# Patient Record
Sex: Male | Born: 1965 | Race: White | Hispanic: No | Marital: Married | Smoking: Former smoker
Health system: Southern US, Community
[De-identification: ages and names within clinical notes are randomized; demographics above are authoritative.]

## PROBLEM LIST (undated history)

## (undated) DIAGNOSIS — T7840XA Allergy, unspecified, initial encounter: Secondary | ICD-10-CM

## (undated) DIAGNOSIS — G4730 Sleep apnea, unspecified: Secondary | ICD-10-CM

## (undated) DIAGNOSIS — E039 Hypothyroidism, unspecified: Secondary | ICD-10-CM

## (undated) DIAGNOSIS — E785 Hyperlipidemia, unspecified: Secondary | ICD-10-CM

## (undated) DIAGNOSIS — K219 Gastro-esophageal reflux disease without esophagitis: Secondary | ICD-10-CM

## (undated) DIAGNOSIS — I2510 Atherosclerotic heart disease of native coronary artery without angina pectoris: Secondary | ICD-10-CM

## (undated) HISTORY — DX: Allergy, unspecified, initial encounter: T78.40XA

## (undated) HISTORY — PX: POLYPECTOMY: SHX149

## (undated) HISTORY — DX: Sleep apnea, unspecified: G47.30

## (undated) HISTORY — DX: Hyperlipidemia, unspecified: E78.5

## (undated) HISTORY — DX: Hypothyroidism, unspecified: E03.9

## (undated) HISTORY — DX: Atherosclerotic heart disease of native coronary artery without angina pectoris: I25.10

## (undated) HISTORY — PX: CARDIAC CATHETERIZATION: SHX172

## (undated) HISTORY — DX: Gastro-esophageal reflux disease without esophagitis: K21.9

---

## 1992-03-13 HISTORY — PX: UVULOPALATOPHARYNGOPLASTY: SHX827

## 1992-03-13 HISTORY — PX: ADENOIDECTOMY: SUR15

## 1998-03-16 ENCOUNTER — Emergency Department (HOSPITAL_COMMUNITY): Admission: EM | Admit: 1998-03-16 | Discharge: 1998-03-16 | Payer: Self-pay | Admitting: Emergency Medicine

## 1999-03-24 ENCOUNTER — Ambulatory Visit (HOSPITAL_COMMUNITY): Admission: RE | Admit: 1999-03-24 | Discharge: 1999-03-24 | Payer: Self-pay | Admitting: Internal Medicine

## 1999-03-24 ENCOUNTER — Encounter: Payer: Self-pay | Admitting: Internal Medicine

## 2000-01-07 ENCOUNTER — Emergency Department (HOSPITAL_COMMUNITY): Admission: EM | Admit: 2000-01-07 | Discharge: 2000-01-07 | Payer: Self-pay | Admitting: Emergency Medicine

## 2000-10-26 ENCOUNTER — Ambulatory Visit (HOSPITAL_COMMUNITY): Admission: RE | Admit: 2000-10-26 | Discharge: 2000-10-26 | Payer: Self-pay | Admitting: Internal Medicine

## 2001-08-08 ENCOUNTER — Encounter: Payer: Self-pay | Admitting: Internal Medicine

## 2001-08-08 ENCOUNTER — Encounter: Admission: RE | Admit: 2001-08-08 | Discharge: 2001-08-08 | Payer: Self-pay | Admitting: Internal Medicine

## 2009-02-08 ENCOUNTER — Encounter: Admission: RE | Admit: 2009-02-08 | Discharge: 2009-02-08 | Payer: Self-pay | Admitting: Internal Medicine

## 2009-06-18 ENCOUNTER — Ambulatory Visit: Payer: Self-pay | Admitting: Pulmonary Disease

## 2009-06-18 DIAGNOSIS — G4733 Obstructive sleep apnea (adult) (pediatric): Secondary | ICD-10-CM

## 2009-06-18 DIAGNOSIS — E039 Hypothyroidism, unspecified: Secondary | ICD-10-CM | POA: Insufficient documentation

## 2009-06-18 DIAGNOSIS — E785 Hyperlipidemia, unspecified: Secondary | ICD-10-CM | POA: Insufficient documentation

## 2009-06-18 DIAGNOSIS — K219 Gastro-esophageal reflux disease without esophagitis: Secondary | ICD-10-CM

## 2009-07-14 ENCOUNTER — Encounter: Payer: Self-pay | Admitting: Pulmonary Disease

## 2009-07-14 ENCOUNTER — Ambulatory Visit (HOSPITAL_BASED_OUTPATIENT_CLINIC_OR_DEPARTMENT_OTHER): Admission: RE | Admit: 2009-07-14 | Discharge: 2009-07-14 | Payer: Self-pay | Admitting: Pulmonary Disease

## 2009-07-25 ENCOUNTER — Ambulatory Visit: Payer: Self-pay | Admitting: Pulmonary Disease

## 2009-07-26 ENCOUNTER — Telehealth (INDEPENDENT_AMBULATORY_CARE_PROVIDER_SITE_OTHER): Payer: Self-pay | Admitting: *Deleted

## 2009-07-28 ENCOUNTER — Ambulatory Visit: Payer: Self-pay | Admitting: Pulmonary Disease

## 2009-09-01 ENCOUNTER — Ambulatory Visit: Payer: Self-pay | Admitting: Pulmonary Disease

## 2009-11-27 ENCOUNTER — Encounter: Payer: Self-pay | Admitting: Pulmonary Disease

## 2010-02-08 ENCOUNTER — Encounter (INDEPENDENT_AMBULATORY_CARE_PROVIDER_SITE_OTHER): Payer: Self-pay | Admitting: *Deleted

## 2010-02-24 ENCOUNTER — Ambulatory Visit: Payer: Self-pay | Admitting: Pulmonary Disease

## 2010-03-22 ENCOUNTER — Ambulatory Visit: Admit: 2010-03-22 | Payer: Self-pay | Admitting: Pulmonary Disease

## 2010-03-23 ENCOUNTER — Telehealth: Payer: Self-pay | Admitting: Pulmonary Disease

## 2010-04-05 ENCOUNTER — Ambulatory Visit
Admission: RE | Admit: 2010-04-05 | Discharge: 2010-04-05 | Payer: Self-pay | Source: Home / Self Care | Attending: Pulmonary Disease | Admitting: Pulmonary Disease

## 2010-04-12 NOTE — Letter (Signed)
SummaryScience writer Pulmonary Care Appointment Letter  [facility]  [address]   [place], [place] [address]   Phone: [phone]  Fax: [phone]    02/08/2010 MRN: [id number]  CHARES SLAYMAKER [address] [place], [place]  [address]  Dear Mr. MATSUURA,   Our office is attempting to contact you about an appointment.  Please call our office at [phone] to schedule this appointment with Dr._Clance. Dr. Shelle Iron is not going to be in the office on 03/03/10. We need to reschedule your appointment.   Our registration staff is prepared to assist you with any questions you may have.    Thank you,   Nature conservation officer Pulmonary Division

## 2010-04-12 NOTE — Assessment & Plan Note (Signed)
Summary: consult for possible osa   Copy to:  Nila Nephew Primary Provider/Referring Provider:  Nila Nephew   CC:  Sleep Consult.  History of Present Illness: The pt is a 45y/o male who comes in today for evaluation of possible osa.  He has a long h/o snoring, and underwent UP3 by Dr. Ezzard Standing in 1994.  This did help, but now the snoring has worsened, and he has been noted to have pauses in his breathing during sleep.  He goes to bed btw 9-10am, and arises at 5:30am to start his day.  He has frequent awakenings at night, and has nonrestorative sleep.  He does have some sleep pressure at work, but stays very active most days.  He does fall asleep easily in the evenings watching tv or movies.  He also admits to sleep pressure with his drive home, which is 35min.  His weight is fairly neutral over the past 2 years, and his epworth score is 16.  Preventive Screening-Counseling & Management  Alcohol-Tobacco     Smoking Status: quit  Current Medications (verified): 1)  Synthroid 75 Mcg Tabs (Levothyroxine Sodium) .... Take 1 Tablet By Mouth Once A Day 2)  Crestor 5 Mg Tabs (Rosuvastatin Calcium) .... Take 1 Tablet By Mouth Once A Day 3)  Nexium 40 Mg Cpdr (Esomeprazole Magnesium) .... Take 1 Tablet By Mouth Once A Day  Allergies (verified): No Known Drug Allergies  Past History:  Past Medical History:  G E R D (ICD-530.81) HYPOTHYROIDISM (ICD-244.9) HYPERLIPIDEMIA (ICD-272.4)    Past Surgical History: s/p UP3 in 1994  Family History: Reviewed history and no changes required. emphysema: father cancer: paternal grandfather (liver) maternal grandmother (liver) maternal grandfather (prostate)   Social History: Reviewed history and no changes required. Patient states former smoker.  started at age 58.  2ppd.  quit 1995. pt is married and lives with wife and 2 daughters. pt has children. pt works as a Dietitian.  Smoking Status:  quit  Review of Systems       The patient  complains of acid heartburn and joint stiffness or pain.  The patient denies shortness of breath with activity, shortness of breath at rest, productive cough, non-productive cough, coughing up blood, chest pain, irregular heartbeats, indigestion, loss of appetite, weight change, abdominal pain, difficulty swallowing, sore throat, tooth/dental problems, headaches, nasal congestion/difficulty breathing through nose, sneezing, itching, ear ache, anxiety, depression, hand/feet swelling, rash, change in color of mucus, and fever.    Vital Signs:  Patient profile:   45 year old male Height:      71 inches Weight:      229.50 pounds BMI:     32.12 O2 Sat:      95 % on Room air Temp:     97.8 degrees F oral Pulse rate:   86 / minute BP sitting:   114 / 78  (right arm) Cuff size:   regular  Vitals Entered By: Arman Filter LPN (June 19, 1306 3:01 PM)  O2 Flow:  Room air CC: Sleep Consult Comments Medications reviewed with patient Arman Filter LPN  June 18, 6576 3:01 PM    Physical Exam  General:  ow male in nad Eyes:  PERRLA and EOMI.   Nose:  narrowed bilat but patent Mouth:  trimmed palate and uvula Neck:  no jvd, tmg, LN Lungs:  clear to auscultation Heart:  rrr, no mrg Abdomen:  soft and nontender, bs+ Extremities:  no edema or cyanosis pulses intact distallly  Neurologic:  alert and oriented, moves all 4.   Impression & Recommendations:  Problem # 1:  OBSTRUCTIVE SLEEP APNEA (ICD-327.23) the pt's history is very suggestive of osa, and he is overweight.  I have had a long discussion with him about osa, including its impact on his CV health and QOL.  I think there is more than enough suspicion to proceed with a sleep study for diagnosis, and the pt agrees.   Medications Added to Medication List This Visit: 1)  Synthroid 75 Mcg Tabs (Levothyroxine sodium) .... Take 1 tablet by mouth once a day 2)  Crestor 5 Mg Tabs (Rosuvastatin calcium) .... Take 1 tablet by mouth once a  day 3)  Nexium 40 Mg Cpdr (Esomeprazole magnesium) .... Take 1 tablet by mouth once a day  Other Orders: Consultation Level IV (84696) Sleep Disorder Referral (Sleep Disorder)  Patient Instructions: 1)  will schedule for sleep study, and will call you to arrange followup once results are available. 2)  work on weight loss.

## 2010-04-12 NOTE — Miscellaneous (Signed)
Summary: optimal cpap pressure 10cm  Clinical Lists Changes  Orders: Added new Referral order of DME Referral (DME) - Signed auto shows good compliance, no leaks, and optmal pressure of 10cm

## 2010-04-12 NOTE — Progress Notes (Signed)
Summary: need to sched ov with kc   ---- Converted from flag ---- ---- 07/26/2009 9:38 AM, Arman Filter LPN wrote: pt needs ov with kc to discuss sleep study results. ------------------------------  [id number].  Aundra Millet Artin Mceuen LPN  Jul 26, 2009 9:39 AM LMOMTCBx2.  Arman Filter LPN  Jul 27, 2009 9:48 AM  pt scheduled to see [facility] 07-28-2009 at 10:00am.  Arman Filter LPN  Jul 27, 2009 11:13 AM

## 2010-04-12 NOTE — Assessment & Plan Note (Signed)
Summary: rov for osa   Copy to:  Nila Nephew Primary Provider/Referring Provider:  Nila Nephew   CC:  5 week rov - F/U on Cpap - Wearing 6 hrs per night - Mask doing good - sinus drainage (green-yellow) for 2 weeks - cough in am - taking mucinex.  History of Present Illness: the pt comes in today for f/u of his known symptomatic moderate osa.  He was started on cpap at the last visit, and feels that he is doing well.  He noticed on the 2 night things were improving.  He sleeps well with the device, and notes that his alertness and memory are greatly improved during the day.  He is using nasal pillows currently without issues, and has adapted well to the pressure.  He has developed a cold, but is improving with mucinex.  Current Medications (verified): 1)  Synthroid 100 Mcg Tabs (Levothyroxine Sodium) .... Take One By Mouth Once Daily 2)  Crestor 5 Mg Tabs (Rosuvastatin Calcium) .... Take 1 Tablet By Mouth Once A Day 3)  Nexium 40 Mg Cpdr (Esomeprazole Magnesium) .... Take 1 Tablet By Mouth Once A Day 4)  Mucinex Dm 30-600 Mg Xr12h-Tab (Dextromethorphan-Guaifenesin) .... Take One By Mouth Two Times A Day As Needed  Allergies (verified): No Known Drug Allergies  Past History:  Past Medical History: Reviewed history from 06/18/2009 and no changes required.  G E R D (ICD-530.81) HYPOTHYROIDISM (ICD-244.9) HYPERLIPIDEMIA (ICD-272.4)    Past Surgical History: Reviewed history from 06/18/2009 and no changes required. s/p UP3 in 1994  Family History: Reviewed history from 06/18/2009 and no changes required. emphysema: father cancer: paternal grandfather (liver) maternal grandmother (liver) maternal grandfather (prostate)   Social History: Reviewed history from 06/18/2009 and no changes required. Patient states former smoker.  started at age 75.  2ppd.  quit 1995. pt is married and lives with wife and 2 daughters. pt has children. pt works as a Dietitian.   Review of  Systems       The patient complains of productive cough.  The patient denies shortness of breath with activity, shortness of breath at rest, non-productive cough, coughing up blood, chest pain, irregular heartbeats, acid heartburn, indigestion, loss of appetite, weight change, abdominal pain, difficulty swallowing, sore throat, tooth/dental problems, headaches, nasal congestion/difficulty breathing through nose, sneezing, itching, ear ache, anxiety, depression, hand/feet swelling, joint stiffness or pain, rash, change in color of mucus, and fever.    Vital Signs:  Patient profile:   45 year old male Height:      71 inches Weight:      227 pounds BMI:     31.77 O2 Sat:      98 % on Room air Temp:     97.5 degrees F oral Pulse rate:   55 / minute BP sitting:   120 / 80  (right arm) Cuff size:   regular  Vitals Entered By: Abigail Miyamoto RN (September 01, 2009 9:05 AM)  O2 Flow:  Room air  Physical Exam  General:  ow male in nad Nose:  mild erythema, no purulence no skin breakdown or pressure necrosis from cpap mask Mouth:  clear Extremities:  no edema or cyanosis Neurologic:  alert and oriented, moves all 4. does not appear sleepy.   Impression & Recommendations:  Problem # 1:  OBSTRUCTIVE SLEEP APNEA (ICD-327.23) the pt is doing very well with cpap, and has a 100% compliance on his download.  He has seen great improvement in sleep quality and  daytime alertness.  He is having no issues with mask or pressure.  We need to optimize his pressure for him, and he needs to work aggressively on weight loss.  Care Plan:  At this point, will arrange for the patient's machine to be changed over to auto mode for 2 weeks to optimize their pressure.  I will review the downloaded data once sent by dme, and also evaluate for compliance, leaks, and residual osa.  I will call the patient and dme to discuss the results, and have the patient's machine set appropriately.  This will serve as the pt's cpap  pressure titration.  Medications Added to Medication List This Visit: 1)  Synthroid 100 Mcg Tabs (Levothyroxine sodium) .... Take one by mouth once daily 2)  Mucinex Dm 30-600 Mg Xr12h-tab (Dextromethorphan-guaifenesin) .... Take one by mouth two times a day as needed  Other Orders: Est. Patient Level IV (16109) DME Referral (DME)  Patient Instructions: 1)  will get your pressure optimized with the auto setting on your machine over the next 2 weeks.  I will let you know the results. 2)  continue to work on weight loss 3)  followup with me in 6mos.

## 2010-04-12 NOTE — Assessment & Plan Note (Signed)
Summary: rov to review sleep study   Copy to:  Nila Nephew Primary Provider/Referring Provider:  Nila Nephew   CC:  Pt is here for a f/u appt to discuss sleep study results.  Pt also c/o PND and coughing up small amount of yellow sputum. Marland Kitchen  History of Present Illness: the pt comes in today for f/u of his recent sleep study.  He was found to have AHI of 27/hr with desat to 86%.  I have reviewed the study with him in detail, and answered all of his questions.  Medications Prior to Update: 1)  Synthroid 75 Mcg Tabs (Levothyroxine Sodium) .... Take 1 Tablet By Mouth Once A Day 2)  Crestor 5 Mg Tabs (Rosuvastatin Calcium) .... Take 1 Tablet By Mouth Once A Day 3)  Nexium 40 Mg Cpdr (Esomeprazole Magnesium) .... Take 1 Tablet By Mouth Once A Day  Allergies (verified): No Known Drug Allergies  Vital Signs:  Patient profile:   45 year old male Height:      71 inches Weight:      228 pounds O2 Sat:      97 % on Room air Temp:     97.9 degrees F oral Pulse rate:   88 / minute BP sitting:   134 / 80  (right arm) Cuff size:   regular  Vitals Entered By: Arman Filter LPN (Jul 28, 2009 9:57 AM)  O2 Flow:  Room air CC: Pt is here for a f/u appt to discuss sleep study results.  Pt also c/o PND and coughing up small amount of yellow sputum.  Comments Medications reviewed with patient Arman Filter LPN  Jul 28, 2009 9:58 AM    Physical Exam  General:  ow male in nad Neurologic:  alert, not sleepy, moves all 4.   Impression & Recommendations:  Problem # 1:  OBSTRUCTIVE SLEEP APNEA (ICD-327.23)  the pt has moderate disease by his sleep study, and clearly is having significant daytime symptoms.  I have reviewed with him the various treatment options, including weight loss, surgery, dental appliance, and cpap.  I believe cpap while working on weight loss represent his best chance of success.  He agrees.  Will set up on cpap at a moderate pressure level to allow for desensitization, and  then will work on optimization.  Time spent with pt today reviewing the above was .  Other Orders: Est. Patient Level III (04540) DME Referral (DME)  Patient Instructions: 1)  will start on cpap at moderate pressure and see you back in 4 weeks. 2)  work on weight loss 3)  followup with me in 4 weeks.

## 2010-04-14 NOTE — Progress Notes (Signed)
Summary: nos appt  Phone Note Call from Patient   Caller: Brian Elliott  Call For: Brian Elliott Summary of Call: Rsc nos from 1/10 to 1/24. Initial call taken by: Darletta Moll,  March 23, 2010 9:42 AM

## 2010-04-14 NOTE — Assessment & Plan Note (Signed)
Summary: rov for osa   Copy to:  Nila Nephew Primary Provider/Referring Provider:  Nila Nephew   CC:  6 month f/u appt on OSA.  Wears cpap machine every night.  Approx 7 hours per night.  Pt denied any complaints with mask or pressure.  Pt denied any new concerns. .  History of Present Illness: the pt comes in today for f/u of his osa.  He is wearing cpap compliantly, and is not having issues with mask fit or presssure.  He is due for new nasal pillows.  He feels he sleeps well, and has seen a big difference in his daytime alertness.  There has been very little weight change in the last 6mos.  Medications Prior to Update: 1)  Synthroid 100 Mcg Tabs (Levothyroxine Sodium) .... Take One By Mouth Once Daily 2)  Crestor 5 Mg Tabs (Rosuvastatin Calcium) .... Take 1 Tablet By Mouth Once A Day 3)  Nexium 40 Mg Cpdr (Esomeprazole Magnesium) .... Take 1 Tablet By Mouth Once A Day 4)  Mucinex Dm 30-600 Mg Xr12h-Tab (Dextromethorphan-Guaifenesin) .... Take One By Mouth Two Times A Day As Needed  Allergies (verified): No Known Drug Allergies  Review of Systems       The patient complains of nasal congestion/difficulty breathing through nose.  The patient denies shortness of breath with activity, shortness of breath at rest, productive cough, non-productive cough, coughing up blood, chest pain, irregular heartbeats, acid heartburn, indigestion, loss of appetite, weight change, abdominal pain, difficulty swallowing, sore throat, tooth/dental problems, headaches, sneezing, itching, ear ache, anxiety, depression, hand/feet swelling, joint stiffness or pain, rash, change in color of mucus, and fever.    Vital Signs:  Patient profile:   45 year old male Height:      71 inches Weight:      231 pounds O2 Sat:      99 % on Room air Temp:     98.0 degrees F oral Pulse rate:   82 / minute BP sitting:   112 / 72  (right arm) Cuff size:   large  Vitals Entered By: Arman Filter LPN (April 05, 2010 8:58  AM)  O2 Flow:  Room air CC: 6 month f/u appt on OSA.  Wears cpap machine every night.  Approx 7 hours per night.  Pt denied any complaints with mask or pressure.  Pt denied any new concerns.  Comments Medications reviewed with patient Arman Filter LPN  April 05, 2010 9:00 AM    Physical Exam  General:  ow male in nad Nose:  no skin breakdown or pressure necrosis from cpap mask Extremities:  no edema or cyanosis Neurologic:  alert, not sleepy, moves all 4.   Impression & Recommendations:  Problem # 1:  OBSTRUCTIVE SLEEP APNEA (ICD-327.23)  the pt is doing well with cpap, and feels he is sleeping well with adequate daytime alertness.  I have asked him to continue with cpap, and to work hard on weight loss.  I have also asked him to keep up with supplies with his dme.  Other Orders: Est. Patient Level III (57846)  Patient Instructions: 1)  continue with cpap 2)  work on weight loss 3)  followup with me in 12mos or sooner if issues.

## 2010-11-01 ENCOUNTER — Telehealth: Payer: Self-pay | Admitting: Pulmonary Disease

## 2010-11-01 DIAGNOSIS — G4733 Obstructive sleep apnea (adult) (pediatric): Secondary | ICD-10-CM

## 2010-11-01 NOTE — Telephone Encounter (Signed)
Pt last saw KC on 04/05/10 and was told to f/u in 1 year. Called and spoke with pt.  Pt is requesting a rx be faxed to [facility] so he can get new cpap supplies.  Kc, please advise if you are ok with this.  Thanks.

## 2010-11-01 NOTE — Telephone Encounter (Signed)
Work for me as long as I have seen him in a year.

## 2010-11-01 NOTE — Telephone Encounter (Signed)
Order was sent to [facility] for new supplies and pt is aware.

## 2011-04-06 ENCOUNTER — Encounter: Payer: Self-pay | Admitting: Pulmonary Disease

## 2011-04-07 ENCOUNTER — Encounter: Payer: Self-pay | Admitting: Pulmonary Disease

## 2011-04-07 ENCOUNTER — Ambulatory Visit (INDEPENDENT_AMBULATORY_CARE_PROVIDER_SITE_OTHER): Payer: Managed Care, Other (non HMO) | Admitting: Pulmonary Disease

## 2011-04-07 VITALS — BP 120/88 | HR 72 | Temp 98.4°F | Ht 69.0 in | Wt 233.4 lb

## 2011-04-07 DIAGNOSIS — G4733 Obstructive sleep apnea (adult) (pediatric): Secondary | ICD-10-CM

## 2011-04-07 NOTE — Patient Instructions (Signed)
Continue with cpap Work on weight loss At your convenience, you can take your machine by your dme for download. followup with me in one year.

## 2011-04-07 NOTE — Progress Notes (Signed)
  Subjective:    Patient ID: Brian Elliott, male    DOB: February 03, 1966, 46 y.o.   MRN: [id number]  HPI The patient comes in today for followup of his obstructive sleep apnea.  He is wearing CPAP compliantly, and reports no issues with his mask fit or pressure.  He feels that he is sleeping well, and has adequate daytime alertness.  His weight is neutral from his last visit a year ago.   Review of Systems  Constitutional: Negative for fever and unexpected weight change.  HENT: Positive for congestion, rhinorrhea and postnasal drip. Negative for ear pain, nosebleeds, sore throat, sneezing, trouble swallowing, dental problem and sinus pressure.   Eyes: Negative for redness and itching.  Respiratory: Negative for cough, chest tightness, shortness of breath and wheezing.   Cardiovascular: Negative for palpitations and leg swelling.  Gastrointestinal: Negative for nausea and vomiting.  Genitourinary: Negative for dysuria.  Musculoskeletal: Negative for joint swelling.  Skin: Negative for rash.  Neurological: Negative for headaches.  Hematological: Does not bruise/bleed easily.  Psychiatric/Behavioral: Negative for dysphoric mood. The patient is not nervous/anxious.        Objective:   Physical Exam Overweight male in no acute distress No skin breakdown or pressure necrosis from the CPAP mask Lower extremities without edema, no cyanosis  Alert and oriented, does not appear to be sleepy, moves all 4 extremities.       Assessment & Plan:

## 2011-04-07 NOTE — Assessment & Plan Note (Signed)
The patient is doing well from a sleep apnea standpoint with his CPAP.  He reports no issues with his mask or device.  I have encouraged him to work aggressively on weight loss, and to keep up with his supplies and mask changes.  He will followup with me in one year.

## 2012-04-05 ENCOUNTER — Encounter: Payer: Self-pay | Admitting: Pulmonary Disease

## 2012-04-05 ENCOUNTER — Ambulatory Visit (INDEPENDENT_AMBULATORY_CARE_PROVIDER_SITE_OTHER): Payer: Managed Care, Other (non HMO) | Admitting: Pulmonary Disease

## 2012-04-05 VITALS — BP 132/90 | HR 76 | Temp 98.1°F | Ht 69.0 in | Wt 240.0 lb

## 2012-04-05 DIAGNOSIS — G4733 Obstructive sleep apnea (adult) (pediatric): Secondary | ICD-10-CM

## 2012-04-05 NOTE — Patient Instructions (Addendum)
Continue with cpap.  Please call if having any issues. Work on weight loss followup with me in one year.

## 2012-04-05 NOTE — Progress Notes (Signed)
  Subjective:    Patient ID: Brian Elliott, male    DOB: Mar 16, 1965, 47 y.o.   MRN: [id number]  HPI The patient comes in today for followup of his known obstructive sleep apnea.  He is wearing CPAP compliantly, and is having no issues with his mask fit or pressure.  He is sleeping well, and has excellent daytime alertness.  Of note, his weight is up about 7 pounds from the last visit.   Review of Systems  Constitutional: Negative for fever and unexpected weight change.  HENT: Positive for congestion, rhinorrhea and postnasal drip. Negative for ear pain, nosebleeds, sore throat, sneezing, trouble swallowing, dental problem and sinus pressure.        Only with temperature change   Eyes: Negative for redness and itching.  Respiratory: Negative for cough, chest tightness, shortness of breath and wheezing.   Cardiovascular: Negative for palpitations and leg swelling.  Gastrointestinal: Negative for nausea and vomiting.  Genitourinary: Negative for dysuria.  Musculoskeletal: Negative for joint swelling.  Skin: Negative for rash.  Neurological: Negative for headaches.  Hematological: Does not bruise/bleed easily.  Psychiatric/Behavioral: Negative for dysphoric mood. The patient is not nervous/anxious.        Objective:   Physical Exam Ow male in nad Nose without purulence or discharge noted. No skin breakdown or pressure necrosis from cpap mask LE without edema, no cyanosis Alert and oriented, does not appear to be sleepy, moves all 4.        Assessment & Plan:

## 2012-04-05 NOTE — Assessment & Plan Note (Signed)
The patient is doing well from a CPAP standpoint, and feels that he is sleeping well with excellent daytime alertness.  He is having no issues with his device.  I have asked him to keep up with mask changes and supplies, and to work aggressively on weight loss.  If he is doing well, he will followup with me in one year.

## 2012-07-29 ENCOUNTER — Other Ambulatory Visit: Payer: Self-pay | Admitting: Pulmonary Disease

## 2012-07-29 ENCOUNTER — Telehealth: Payer: Self-pay | Admitting: Pulmonary Disease

## 2012-07-29 DIAGNOSIS — G4733 Obstructive sleep apnea (adult) (pediatric): Secondary | ICD-10-CM

## 2012-07-29 NOTE — Telephone Encounter (Signed)
Will send an order to put on auto to reoptimize his pressure.

## 2012-07-29 NOTE — Telephone Encounter (Signed)
I spoke with the pt and he states he has lost 35 lbs since last OV and thinks his pressure may need to be adjusted. He states that he is waking up in the middle of the night because it feels like there is too much pressure. He states that he has gone a few days that he has slept without his machine and he wife states he has not been snoring but she did witness him stop breathing a few times so he has begun using it again. Please advise. Carron Curie, CMA No Known Allergies

## 2012-07-30 NOTE — Telephone Encounter (Signed)
Spoke with Brian Elliott and informed of Dr Shelle Iron recommendations and that order was placed and he should receive call from homecare company.

## 2012-09-22 ENCOUNTER — Other Ambulatory Visit: Payer: Self-pay | Admitting: Pulmonary Disease

## 2012-09-22 DIAGNOSIS — G4733 Obstructive sleep apnea (adult) (pediatric): Secondary | ICD-10-CM

## 2013-04-07 ENCOUNTER — Ambulatory Visit (INDEPENDENT_AMBULATORY_CARE_PROVIDER_SITE_OTHER): Payer: Managed Care, Other (non HMO) | Admitting: Pulmonary Disease

## 2013-04-07 ENCOUNTER — Encounter: Payer: Self-pay | Admitting: Pulmonary Disease

## 2013-04-07 VITALS — BP 118/82 | HR 72 | Temp 97.7°F | Ht 69.0 in | Wt 216.4 lb

## 2013-04-07 DIAGNOSIS — Z23 Encounter for immunization: Secondary | ICD-10-CM

## 2013-04-07 DIAGNOSIS — G4733 Obstructive sleep apnea (adult) (pediatric): Secondary | ICD-10-CM

## 2013-04-07 NOTE — Assessment & Plan Note (Signed)
The patient is doing well on CPAP currently, and has lost 24 pounds since last visit. I have commended him on this, and asked him to continue with his weight loss program. He will need to keep up with mask changes and supplies, and I will see him back in one year if doing well.

## 2013-04-07 NOTE — Patient Instructions (Signed)
Stay on cpap, and keep up with mask changes and supplies. Keep working on weight loss, you are doing well. Will give you the flu shot today followup with me again in one year if doing well.

## 2013-04-07 NOTE — Progress Notes (Signed)
   Subjective:    Patient ID: Brian Elliott, male    DOB: 09/16/1965, 48 y.o.   MRN: [id number]  HPI Patient comes in today for followup of his obstructive sleep apnea. He is wearing CPAP compliantly without significant issues, and has no problems with his mask fit or pressure. His download shows excellent compliance, good control of his sleep apnea, and no significant mask leaks. The patient has lost 24 pounds since his last visit, and feels that he is sleeping well with excellent daytime alertness. He has minor dryness issues at times, but does not feel that is overly significant.   Review of Systems  Constitutional: Negative for fever and unexpected weight change.  HENT: Negative for congestion, dental problem, ear pain, nosebleeds, postnasal drip, rhinorrhea, sinus pressure, sneezing, sore throat and trouble swallowing.   Eyes: Negative for redness and itching.  Respiratory: Negative for cough, chest tightness, shortness of breath and wheezing.   Cardiovascular: Negative for palpitations and leg swelling.  Gastrointestinal: Negative for nausea and vomiting.  Genitourinary: Negative for dysuria.  Musculoskeletal: Negative for joint swelling.  Skin: Negative for rash.  Neurological: Negative for headaches.  Hematological: Does not bruise/bleed easily.  Psychiatric/Behavioral: Negative for dysphoric mood. The patient is not nervous/anxious.        Objective:   Physical Exam Well-developed male in no acute distress Nose without purulence or discharge noted No skin breakdown or pressure necrosis from the CPAP mask Neck without lymphadenopathy or thyromegaly Lower extremities without edema, no cyanosis Alert and oriented, moves all 4 extremities.        Assessment & Plan:

## 2014-04-07 ENCOUNTER — Ambulatory Visit (INDEPENDENT_AMBULATORY_CARE_PROVIDER_SITE_OTHER): Payer: Managed Care, Other (non HMO) | Admitting: Pulmonary Disease

## 2014-04-07 ENCOUNTER — Encounter: Payer: Self-pay | Admitting: Pulmonary Disease

## 2014-04-07 ENCOUNTER — Encounter (INDEPENDENT_AMBULATORY_CARE_PROVIDER_SITE_OTHER): Payer: Self-pay

## 2014-04-07 VITALS — BP 130/72 | HR 71 | Temp 97.2°F | Ht 69.5 in | Wt 227.6 lb

## 2014-04-07 DIAGNOSIS — G4733 Obstructive sleep apnea (adult) (pediatric): Secondary | ICD-10-CM

## 2014-04-07 NOTE — Assessment & Plan Note (Signed)
The patient has been doing well on his C Pap, but is overdue for new supplies. He also is telling me that his SD card is not working properly, and he thinks it is the device rather than the card itself. I will get him referred to his home care company to have this checked. I've encouraged him to stay on his device compliantly, and to work aggressively on weight loss. I will see him back in one year if doing well.

## 2014-04-07 NOTE — Patient Instructions (Signed)
Will send an order to your home care company for new supplies, and to check your machine. Work on weight loss followup with me again in one year.

## 2014-04-07 NOTE — Progress Notes (Signed)
   Subjective:    Patient ID: Brian Elliott, male    DOB: [birth year], 49 y.o.   MRN: [id number]  HPI The patient comes in today for follow-up of his obstructive sleep apnea. He is wearing C Pap compliantly, and is having no issues with his pressure. He is overdue for mask changes and supplies, and is also having an issue with his download card. He thinks the machine needs to be replaced. He feels that he is sleeping well, and is satisfied with his daytime alertness. Of note, his weight is up 11 pounds from the last visit.   Review of Systems  Constitutional: Negative for fever and unexpected weight change.  HENT: Negative for congestion, dental problem, ear pain, nosebleeds, postnasal drip, rhinorrhea, sinus pressure, sneezing, sore throat and trouble swallowing.   Eyes: Negative for redness and itching.  Respiratory: Negative for cough, chest tightness, shortness of breath and wheezing.   Cardiovascular: Negative for palpitations and leg swelling.  Gastrointestinal: Negative for nausea and vomiting.  Genitourinary: Negative for dysuria.  Musculoskeletal: Negative for joint swelling.  Skin: Negative for rash.  Neurological: Negative for headaches.  Hematological: Does not bruise/bleed easily.  Psychiatric/Behavioral: Negative for dysphoric mood. The patient is not nervous/anxious.        Objective:   Physical Exam  Overweight male in no acute distress Nose without purulence or discharge noted No skin breakdown or pressure necrosis from the C Pap mask Neck without lymphadenopathy or thyromegaly Lower extremities without edema, no cyanosis Alert and oriented, moves all 4 extremities.       Assessment & Plan:

## 2014-08-05 ENCOUNTER — Encounter: Payer: Self-pay | Admitting: Gastroenterology

## 2014-10-01 ENCOUNTER — Ambulatory Visit (AMBULATORY_SURGERY_CENTER): Payer: Self-pay | Admitting: *Deleted

## 2014-10-01 VITALS — Ht 69.0 in | Wt 226.0 lb

## 2014-10-01 DIAGNOSIS — Z1211 Encounter for screening for malignant neoplasm of colon: Secondary | ICD-10-CM

## 2014-10-01 MED ORDER — NA SULFATE-K SULFATE-MG SULF 17.5-3.13-1.6 GM/177ML PO SOLN: 1.0000 | Freq: Once | ORAL | Status: DC

## 2014-10-01 NOTE — Progress Notes (Signed)
No egg or soy allergy No issues with past sedation No diet pills No home 02  emmi video to email provided by pt

## 2014-10-15 ENCOUNTER — Encounter: Payer: Self-pay | Admitting: Gastroenterology

## 2014-10-15 ENCOUNTER — Ambulatory Visit (AMBULATORY_SURGERY_CENTER): Payer: Managed Care, Other (non HMO) | Admitting: Gastroenterology

## 2014-10-15 VITALS — BP 101/74 | HR 73 | Temp 97.2°F | Resp 18 | Ht 69.0 in | Wt 226.0 lb

## 2014-10-15 DIAGNOSIS — Z1211 Encounter for screening for malignant neoplasm of colon: Secondary | ICD-10-CM

## 2014-10-15 DIAGNOSIS — D122 Benign neoplasm of ascending colon: Secondary | ICD-10-CM | POA: Diagnosis not present

## 2014-10-15 DIAGNOSIS — D125 Benign neoplasm of sigmoid colon: Secondary | ICD-10-CM

## 2014-10-15 HISTORY — PX: COLONOSCOPY: SHX174

## 2014-10-15 MED ORDER — SODIUM CHLORIDE 0.9 % IV SOLN: 500.0000 mL | INTRAVENOUS | Status: DC

## 2014-10-15 NOTE — Op Note (Signed)
[place]  [address] [place], [id number]   COLONOSCOPY PROCEDURE REPORT  PATIENT: Brian, Elliott  MR#: [id number] BIRTHDATE: 1965/07/19 , 49  yrs. old GENDER: male ENDOSCOPIST: Ladene Artist, MD, [facility] REFERRED XL:KGMWN Nyoka Cowden, M.D. PROCEDURE DATE:  10/15/2014 PROCEDURE:   Colonoscopy, screening and Colonoscopy with snare polypectomy First Screening Colonoscopy - Avg.  risk and is 50 yrs.  old or older Yes.  Prior Negative Screening - Now for repeat screening. N/A  History of Adenoma - Now for follow-up colonoscopy & has been > or = to 3 yrs.  N/A  Polyps removed today? Yes ASA CLASS:   Class II INDICATIONS:Screening for colonic neoplasia and Colorectal Neoplasm Risk Assessment for this procedure is average risk. MEDICATIONS: Monitored anesthesia care and Propofol 250 mg IV DESCRIPTION OF PROCEDURE:   After the risks benefits and alternatives of the procedure were thoroughly explained, informed consent was obtained.  The digital rectal exam revealed no abnormalities of the rectum.   The LB [id number]  endoscope was introduced through the anus and advanced to the cecum, which was identified by both the appendix and ileocecal valve. No adverse events experienced.   The quality of the prep was excellent. (Suprep was used)  The instrument was then slowly withdrawn as the colon was fully examined. Estimated blood loss is zero unless otherwise noted in this procedure report.  COLON FINDINGS: A sessile polyp measuring 6 mm in size was found in the ascending colon. A polypectomy was performed with a cold snare. The resection was complete, the polyp tissue was completely retrieved and sent to histology. A pedunculated polyp measuring 8 mm in size was found in the sigmoid colon. A polypectomy was performed using snare cautery. The resection was complete, the polyp tissue was completely retrieved and sent to histology. There was mild diverticulosis noted in  the sigmoid colon.   The examination was otherwise normal.  Retroflexed views revealed internal Grade I hemorrhoids. The time to cecum = 1.8 Withdrawal time = 10.7   The scope was withdrawn and the procedure completed. COMPLICATIONS: There were no immediate complications.  ENDOSCOPIC IMPRESSION: 1.   Sessile polyp in the ascending colon; polypectomy performed with a cold snare 2.   Pedunculated polyp in the sigmoid colon; polypectomy performed using snare cautery 3.   Mild diverticulosis in the sigmoid colon 4.   Grade l internal hemorrhoids  RECOMMENDATIONS: 1.  Hold Aspirin and all other NSAIDS for 2 weeks. 2.  High fiber diet with liberal fluid intake. 3.  Repeat colonoscopy in 5 years if polyp(s) adenomatous; otherwise 10 years  eSigned:  Ladene Artist, MD, [facility] 10/15/2014 9:43 AM

## 2014-10-15 NOTE — Progress Notes (Signed)
Transferred to recovery room. A/O x3, pleased with MAC.  VSS.  Report to Cecelia, RN. 

## 2014-10-15 NOTE — Progress Notes (Signed)
Called to room to assist during endoscopic procedure.  Patient ID and intended procedure confirmed with present staff. Received instructions for my participation in the procedure from the performing physician.  

## 2014-10-15 NOTE — Patient Instructions (Signed)
Discharge instructions given. Handouts on polyps,diverticulosis and hemorrhoids. Hold aspirin and all other NSAIDS for 2 weeks. resume  Previous medications. YOU HAD AN ENDOSCOPIC PROCEDURE TODAY AT [facility]:   Refer to the procedure report that was given to you for any specific questions about what was found during the examination.  If the procedure report does not answer your questions, please call your gastroenterologist to clarify.  If you requested that your care partner not be given the details of your procedure findings, then the procedure report has been included in a sealed envelope for you to review at your convenience later.  YOU SHOULD EXPECT: Some feelings of bloating in the abdomen. Passage of more gas than usual.  Walking can help get rid of the air that was put into your GI tract during the procedure and reduce the bloating. If you had a lower endoscopy (such as a colonoscopy or flexible sigmoidoscopy) you may notice spotting of blood in your stool or on the toilet paper. If you underwent a bowel prep for your procedure, you may not have a normal bowel movement for a few days.  Please Note:  You might notice some irritation and congestion in your nose or some drainage.  This is from the oxygen used during your procedure.  There is no need for concern and it should clear up in a day or so.  SYMPTOMS TO REPORT IMMEDIATELY:   Following lower endoscopy (colonoscopy or flexible sigmoidoscopy):  Excessive amounts of blood in the stool  Significant tenderness or worsening of abdominal pains  Swelling of the abdomen that is new, acute  Fever of 100F or higher   For urgent or emergent issues, a gastroenterologist can be reached at any hour by calling [phone].   DIET: Your first meal following the procedure should be a small meal and then it is ok to progress to your normal diet. Heavy or fried foods are harder to digest and may make you feel nauseous or  bloated.  Likewise, meals heavy in dairy and vegetables can increase bloating.  Drink plenty of fluids but you should avoid alcoholic beverages for 24 hours.  ACTIVITY:  You should plan to take it easy for the rest of today and you should NOT DRIVE or use heavy machinery until tomorrow (because of the sedation medicines used during the test).    FOLLOW UP: Our staff will call the number listed on your records the next business day following your procedure to check on you and address any questions or concerns that you may have regarding the information given to you following your procedure. If we do not reach you, we will leave a message.  However, if you are feeling well and you are not experiencing any problems, there is no need to return our call.  We will assume that you have returned to your regular daily activities without incident.  If any biopsies were taken you will be contacted by phone or by letter within the next 1-3 weeks.  Please call us at [phone] if you have not heard about the biopsies in 3 weeks.    SIGNATURES/CONFIDENTIALITY: You and/or your care partner have signed paperwork which will be entered into your electronic medical record.  These signatures attest to the fact that that the information above on your After Visit Summary has been reviewed and is understood.  Full responsibility of the confidentiality of this discharge information lies with you and/or your care-partner.

## 2014-10-16 ENCOUNTER — Telehealth: Payer: Self-pay | Admitting: *Deleted

## 2014-10-16 NOTE — Telephone Encounter (Signed)
No answer, left message to call if questions or concerns. 

## 2014-10-26 ENCOUNTER — Encounter: Payer: Self-pay | Admitting: Gastroenterology

## 2015-04-08 ENCOUNTER — Ambulatory Visit: Payer: Managed Care, Other (non HMO) | Admitting: Pulmonary Disease

## 2015-04-13 ENCOUNTER — Encounter: Payer: Self-pay | Admitting: Pulmonary Disease

## 2015-04-13 ENCOUNTER — Ambulatory Visit (INDEPENDENT_AMBULATORY_CARE_PROVIDER_SITE_OTHER): Payer: Managed Care, Other (non HMO) | Admitting: Pulmonary Disease

## 2015-04-13 VITALS — BP 122/78 | HR 65 | Ht 69.0 in | Wt 194.8 lb

## 2015-04-13 DIAGNOSIS — G4733 Obstructive sleep apnea (adult) (pediatric): Secondary | ICD-10-CM | POA: Diagnosis not present

## 2015-04-13 NOTE — Assessment & Plan Note (Addendum)
Congratulations on wt loss Home sleep study - based on this we will reduce CPAP pressure or get you a new machine if needed Renew supplies meantime  Weight loss encouraged, compliance with goal of at least 6 hrs every night is the expectation. Advised against medications with sedative side effects Cautioned against driving when sleepy - understanding that sleepiness will vary on a day to day basis  Greater than 50% time was spent in counseling and coordination of care with the patient

## 2015-04-13 NOTE — Patient Instructions (Signed)
Congratulations on wt loss Home sleep study - based on this we will reduce CPAP pressure or get you a new machine if needed

## 2015-04-13 NOTE — Progress Notes (Signed)
   Subjective:    Patient ID: Brian Elliott, male    DOB: [birth year], 50 y.o.   MRN: [id number]  HPI  Chief Complaint  Patient presents with  . Follow-up    Former [place] pt. Pt states that he is wearing his CPAP nightly for about 6-7 hours. Pt does state that he often wakes up about 3am and removes his mask. He does feel that the CPAP has helped his sleep. He has lost weight and wonders if he need to have the machine reset or a new mask. Pt also states that the SD card is not working so no download is available. No other complaints or concerns.    Annual follow-up of OSA Was up to 240 lbs, lost 33 over 1 yr His CPAP machine is a few years old-does not have a cart and cannot get download. CPAP was certainly help him sleep better. He is lost considerable weight with a diet program-he wonders if the machine has to be reset with lower pressure, seems like the pressure was increased to about 10 cm in the past Mask is okay, no nasal or oral dryness, he is compliant with the CPAP  UPPP 1990s NPSG 2011:  AHI 27/hr. (220 lbs)  Auto 2014:  Optimal pressure 8cm  Review of Systems neg for any significant sore throat, dysphagia, itching, sneezing, nasal congestion or excess/ purulent secretions, fever, chills, sweats, unintended wt loss, pleuritic or exertional cp, hempoptysis, orthopnea pnd or change in chronic leg swelling.     Objective:   Physical Exam  Gen. Pleasant, well-nourished, in no distress ENT - no lesions, no post nasal drip Neck: No JVD, no thyromegaly, no carotid bruits Lungs: no use of accessory muscles, no dullness to percussion, clear without rales or rhonchi  Cardiovascular: Rhythm regular, heart sounds  normal, no murmurs or gallops, no peripheral edema        Assessment & Plan:

## 2015-04-28 DIAGNOSIS — G4733 Obstructive sleep apnea (adult) (pediatric): Secondary | ICD-10-CM | POA: Diagnosis not present

## 2015-05-04 ENCOUNTER — Telehealth: Payer: Self-pay | Admitting: Pulmonary Disease

## 2015-05-04 DIAGNOSIS — G4733 Obstructive sleep apnea (adult) (pediatric): Secondary | ICD-10-CM

## 2015-05-04 NOTE — Telephone Encounter (Signed)
I called spoke with pt. He had HST done last week and is requesting these results. Pt aware Dr. Elsworth Soho has not been in the office.  Please advise Dr. Elsworth Soho thanks

## 2015-05-11 NOTE — Telephone Encounter (Signed)
OSA is better - AHI decreased to 11/h Still needs CPAP though

## 2015-05-11 NOTE — Telephone Encounter (Signed)
RA please advise. thanks 

## 2015-05-12 ENCOUNTER — Other Ambulatory Visit: Payer: Self-pay | Admitting: *Deleted

## 2015-05-12 DIAGNOSIS — G4733 Obstructive sleep apnea (adult) (pediatric): Secondary | ICD-10-CM

## 2015-05-12 NOTE — Telephone Encounter (Signed)
Spoke with the pt and notified of recs per RA  He is asking if the pressure on CPAP needs to be adjusted and if we can order a new machine for him  He states that his current machine is moe than 50 y/o  Please advise, thanks!

## 2015-05-12 NOTE — Telephone Encounter (Signed)
OK to order autoCPAP 5-15 cm Download in 1 month to review pressure settings

## 2015-05-13 ENCOUNTER — Telehealth: Payer: Self-pay | Admitting: Pulmonary Disease

## 2015-05-13 NOTE — Telephone Encounter (Signed)
Pt is aware that we will change his CPAP pressure. Order has been placed for a new machine (per pt's request) and for pressure change. Nothing further was needed.

## 2015-05-13 NOTE — Telephone Encounter (Signed)
Per Dr. Elsworth Soho:  AHI: decreased to 11/hr. Sleep Apnea is doing better, but still needs to be on CPAP Ok for new CPAP   ------------------ Called and left message for patient to call back.

## 2015-05-18 NOTE — Telephone Encounter (Signed)
Left message for patient to call back  

## 2015-05-25 NOTE — Telephone Encounter (Signed)
Left message for patient to call back  

## 2015-05-31 NOTE — Telephone Encounter (Signed)
Left message for patient to call back  

## 2015-06-01 ENCOUNTER — Encounter: Payer: Self-pay | Admitting: *Deleted

## 2015-06-01 NOTE — Telephone Encounter (Signed)
Letter has been sent to pt to return our call about his sleep study results.

## 2015-06-03 ENCOUNTER — Telehealth: Payer: Self-pay | Admitting: Pulmonary Disease

## 2015-06-03 NOTE — Telephone Encounter (Signed)
Glean Hess, CMA at 05/13/2015 10:44 AM     Status: Signed       Expand All Collapse All   Per Dr. Elsworth Soho:  AHI: decreased to 11/hr. Sleep Apnea is doing better, but still needs to be on CPAP Ok for new CPAP       Spoke with pt. He is aware of his sleep study. States that he was already set up with his new CPAP machine. Nothing further was needed.

## 2018-03-08 ENCOUNTER — Ambulatory Visit
Admission: RE | Admit: 2018-03-08 | Discharge: 2018-03-08 | Disposition: A | Discharge status: Home / Self Care | Payer: Managed Care, Other (non HMO) | Source: Ambulatory Visit | Attending: Internal Medicine | Admitting: Internal Medicine

## 2018-03-08 ENCOUNTER — Other Ambulatory Visit: Payer: Self-pay | Admitting: Internal Medicine

## 2018-03-08 DIAGNOSIS — R05 Cough: Secondary | ICD-10-CM

## 2018-03-08 DIAGNOSIS — R059 Cough, unspecified: Secondary | ICD-10-CM

## 2019-08-19 ENCOUNTER — Encounter: Payer: Self-pay | Admitting: Gastroenterology

## 2019-09-25 ENCOUNTER — Encounter: Payer: Self-pay | Admitting: Gastroenterology

## 2019-11-13 ENCOUNTER — Encounter: Payer: Self-pay | Admitting: Gastroenterology

## 2019-11-13 ENCOUNTER — Ambulatory Visit (AMBULATORY_SURGERY_CENTER): Payer: Self-pay | Admitting: *Deleted

## 2019-11-13 ENCOUNTER — Other Ambulatory Visit: Payer: Self-pay

## 2019-11-13 VITALS — Ht 69.0 in | Wt 229.0 lb

## 2019-11-13 DIAGNOSIS — Z86010 Personal history of colonic polyps: Secondary | ICD-10-CM

## 2019-11-13 MED ORDER — NA SULFATE-K SULFATE-MG SULF 17.5-3.13-1.6 GM/177ML PO SOLN: ORAL | 0 refills | Status: DC

## 2019-11-13 NOTE — Progress Notes (Signed)
Patient is here in-person for PV. Patient denies any allergies to eggs or soy. Patient denies any problems with anesthesia/sedation. Patient denies any oxygen use at home. Patient denies taking any diet/weight loss medications or blood thinners. Patient is not being treated for MRSA or C-diff. Patient is aware of our care-partner policy and VOJJK-09 safety protocol.    COVID-19 vaccines completed in May 2021 per pt. Prep Prescription coupon given to the patient.

## 2019-11-27 ENCOUNTER — Encounter: Payer: Self-pay | Admitting: Gastroenterology

## 2019-11-27 ENCOUNTER — Other Ambulatory Visit: Payer: Self-pay

## 2019-11-27 ENCOUNTER — Ambulatory Visit (AMBULATORY_SURGERY_CENTER): Payer: Managed Care, Other (non HMO) | Admitting: Gastroenterology

## 2019-11-27 VITALS — BP 119/72 | HR 62 | Temp 97.5°F | Resp 14 | Ht 69.0 in | Wt 229.0 lb

## 2019-11-27 DIAGNOSIS — Z86010 Personal history of colonic polyps: Secondary | ICD-10-CM | POA: Diagnosis present

## 2019-11-27 MED ORDER — SODIUM CHLORIDE 0.9 % IV SOLN: 500.0000 mL | Freq: Once | INTRAVENOUS | Status: DC

## 2019-11-27 NOTE — Progress Notes (Signed)
Pt's states no medical or surgical changes since previsit or office visit. 

## 2019-11-27 NOTE — Op Note (Signed)
[facility] Patient Name: Brian Elliott Procedure Date: 11/27/2019 9:20 AM MRN: [id number] Endoscopist: Ladene Artist , MD Age: 54 Referring MD:  Date of Birth: 10-13-1965 Gender: Male Account #: [id number] Procedure:                Colonoscopy Indications:              Surveillance: Personal history of adenomatous                            polyps on last colonoscopy 5 years ago Medicines:                Monitored Anesthesia Care Procedure:                Pre-Anesthesia Assessment:                           - Prior to the procedure, a History and Physical                            was performed, and patient medications and                            allergies were reviewed. The patient's tolerance of                            previous anesthesia was also reviewed. The risks                            and benefits of the procedure and the sedation                            options and risks were discussed with the patient.                            All questions were answered, and informed consent                            was obtained. Prior Anticoagulants: The patient has                            taken no previous anticoagulant or antiplatelet                            agents. ASA Grade Assessment: II - A patient with                            mild systemic disease. After reviewing the risks                            and benefits, the patient was deemed in                            satisfactory condition to undergo the procedure.  After obtaining informed consent, the colonoscope                            was passed under direct vision. Throughout the                            procedure, the patient's blood pressure, pulse, and                            oxygen saturations were monitored continuously. The                            Colonoscope was introduced through the anus and                            advanced to the the cecum,  identified by                            appendiceal orifice and ileocecal valve. The                            ileocecal valve, appendiceal orifice, and rectum                            were photographed. The quality of the bowel                            preparation was excellent. The colonoscopy was                            performed without difficulty. The patient tolerated                            the procedure well. Scope In: 9:25:21 AM Scope Out: 9:37:35 AM Scope Withdrawal Time: 0 hours 9 minutes 9 seconds  Total Procedure Duration: 0 hours 12 minutes 14 seconds  Findings:                 The perianal and digital rectal examinations were                            normal.                           A few medium-mouthed diverticula were found in the                            left colon. There was no evidence of diverticular                            bleeding.                           The exam was otherwise without abnormality on  direct and retroflexion views. Complications:            No immediate complications. Estimated blood loss:                            None. Estimated Blood Loss:     Estimated blood loss: none. Impression:               - Mild diverticulosis in the left colon. There was                            no evidence of diverticular bleeding.                           - The examination was otherwise normal on direct                            and retroflexion views.                           - No specimens collected. Recommendation:           - Repeat colonoscopy in 7 years for surveillance.                           - Patient has a contact number available for                            emergencies. The signs and symptoms of potential                            delayed complications were discussed with the                            patient. Return to normal activities tomorrow.                            Written discharge  instructions were provided to the                            patient.                           - Resume previous diet.                           - Continue present medications. Ladene Artist, MD 11/27/2019 9:47:05 AM This report has been signed electronically.

## 2019-11-27 NOTE — Progress Notes (Signed)
A and O x3. Report to RN. Tolerated MAC anesthesia well.

## 2019-11-27 NOTE — Patient Instructions (Signed)
   Handout out given to you on Diverticulosis today. Repeat Colonoscopy in 7 years.  YOU HAD AN ENDOSCOPIC PROCEDURE TODAY AT [facility]:   Refer to the procedure report that was given to you for any specific questions about what was found during the examination.  If the procedure report does not answer your questions, please call your gastroenterologist to clarify.  If you requested that your care partner not be given the details of your procedure findings, then the procedure report has been included in a sealed envelope for you to review at your convenience later.  YOU SHOULD EXPECT: Some feelings of bloating in the abdomen. Passage of more gas than usual.  Walking can help get rid of the air that was put into your GI tract during the procedure and reduce the bloating. If you had a lower endoscopy (such as a colonoscopy or flexible sigmoidoscopy) you may notice spotting of blood in your stool or on the toilet paper. If you underwent a bowel prep for your procedure, you may not have a normal bowel movement for a few days.  Please Note:  You might notice some irritation and congestion in your nose or some drainage.  This is from the oxygen used during your procedure.  There is no need for concern and it should clear up in a day or so.  SYMPTOMS TO REPORT IMMEDIATELY:   Following lower endoscopy (colonoscopy or flexible sigmoidoscopy):  Excessive amounts of blood in the stool  Significant tenderness or worsening of abdominal pains  Swelling of the abdomen that is new, acute  Fever of 100F or higher   For urgent or emergent issues, a gastroenterologist can be reached at any hour by calling [phone]. Do not use MyChart messaging for urgent concerns.    DIET:  We do recommend a small meal at first, but then you may proceed to your regular diet.  Drink plenty of fluids but you should avoid alcoholic beverages for 24 hours.  ACTIVITY:  You should plan to take it easy for  the rest of today and you should NOT DRIVE or use heavy machinery until tomorrow (because of the sedation medicines used during the test).    FOLLOW UP: Our staff will call the number listed on your records 48-72 hours following your procedure to check on you and address any questions or concerns that you may have regarding the information given to you following your procedure. If we do not reach you, we will leave a message.  We will attempt to reach you two times.  During this call, we will ask if you have developed any symptoms of COVID 19. If you develop any symptoms (ie: fever, flu-like symptoms, shortness of breath, cough etc.) before then, please call [phone].  If you test positive for Covid 19 in the 2 weeks post procedure, please call and report this information to [place].    If any biopsies were taken you will be contacted by phone or by letter within the next 1-3 weeks.  Please call us at [phone] if you have not heard about the biopsies in 3 weeks.    SIGNATURES/CONFIDENTIALITY: You and/or your care partner have signed paperwork which will be entered into your electronic medical record.  These signatures attest to the fact that that the information above on your After Visit Summary has been reviewed and is understood.  Full responsibility of the confidentiality of this discharge information lies with you and/or your care-partner.

## 2019-12-01 ENCOUNTER — Telehealth: Payer: Self-pay

## 2019-12-01 NOTE — Telephone Encounter (Signed)
  Follow up Call-  Call back number 11/27/2019  Post procedure Call Back phone  # [phone]  Permission to leave phone message Yes  Some recent data might be hidden     Patient questions:  Do you have a fever, pain , or abdominal swelling? No. Pain Score  0 *  Have you tolerated food without any problems? Yes.    Have you been able to return to your normal activities? Yes.    Do you have any questions about your discharge instructions: Diet   No. Medications  No. Follow up visit  No.  Do you have questions or concerns about your Care? No.  Actions: * If pain score is 4 or above: No action needed, pain <4.  1. Have you developed a fever since your procedure? No   2.   Have you had an respiratory symptoms (SOB or cough) since your procedure? No   3.   Have you tested positive for COVID 19 since your procedure? No   4.   Have you had any family members/close contacts diagnosed with the COVID 19 since your procedure?  No    If yes to any of these questions please route to Joylene John, RN and Joella Prince, RN

## 2020-09-02 IMAGING — CR DG CHEST 2V
2 series · 2 of 2 positions shown · non-contrast
Comparison: None.

CLINICAL DATA: Cough, congestion

EXAM:
CHEST - 2 VIEW

[w chest pa]
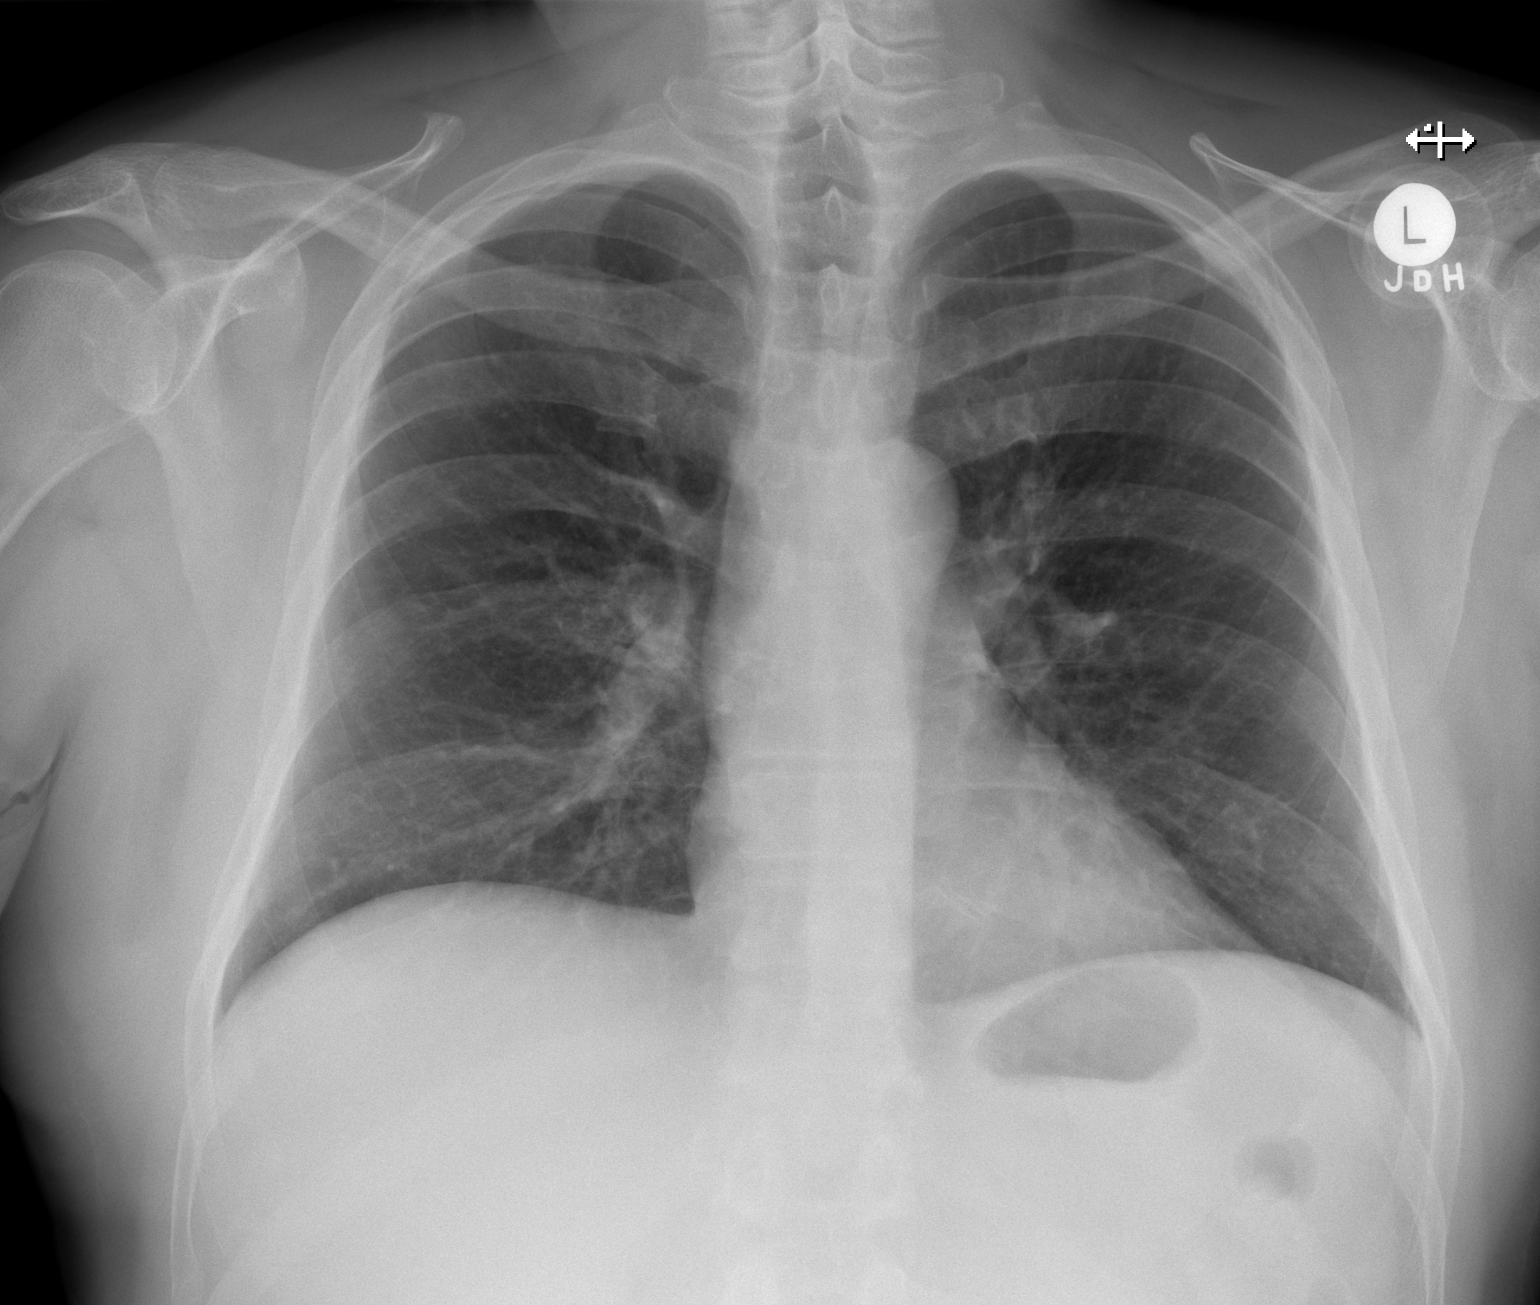

[w chest lat]
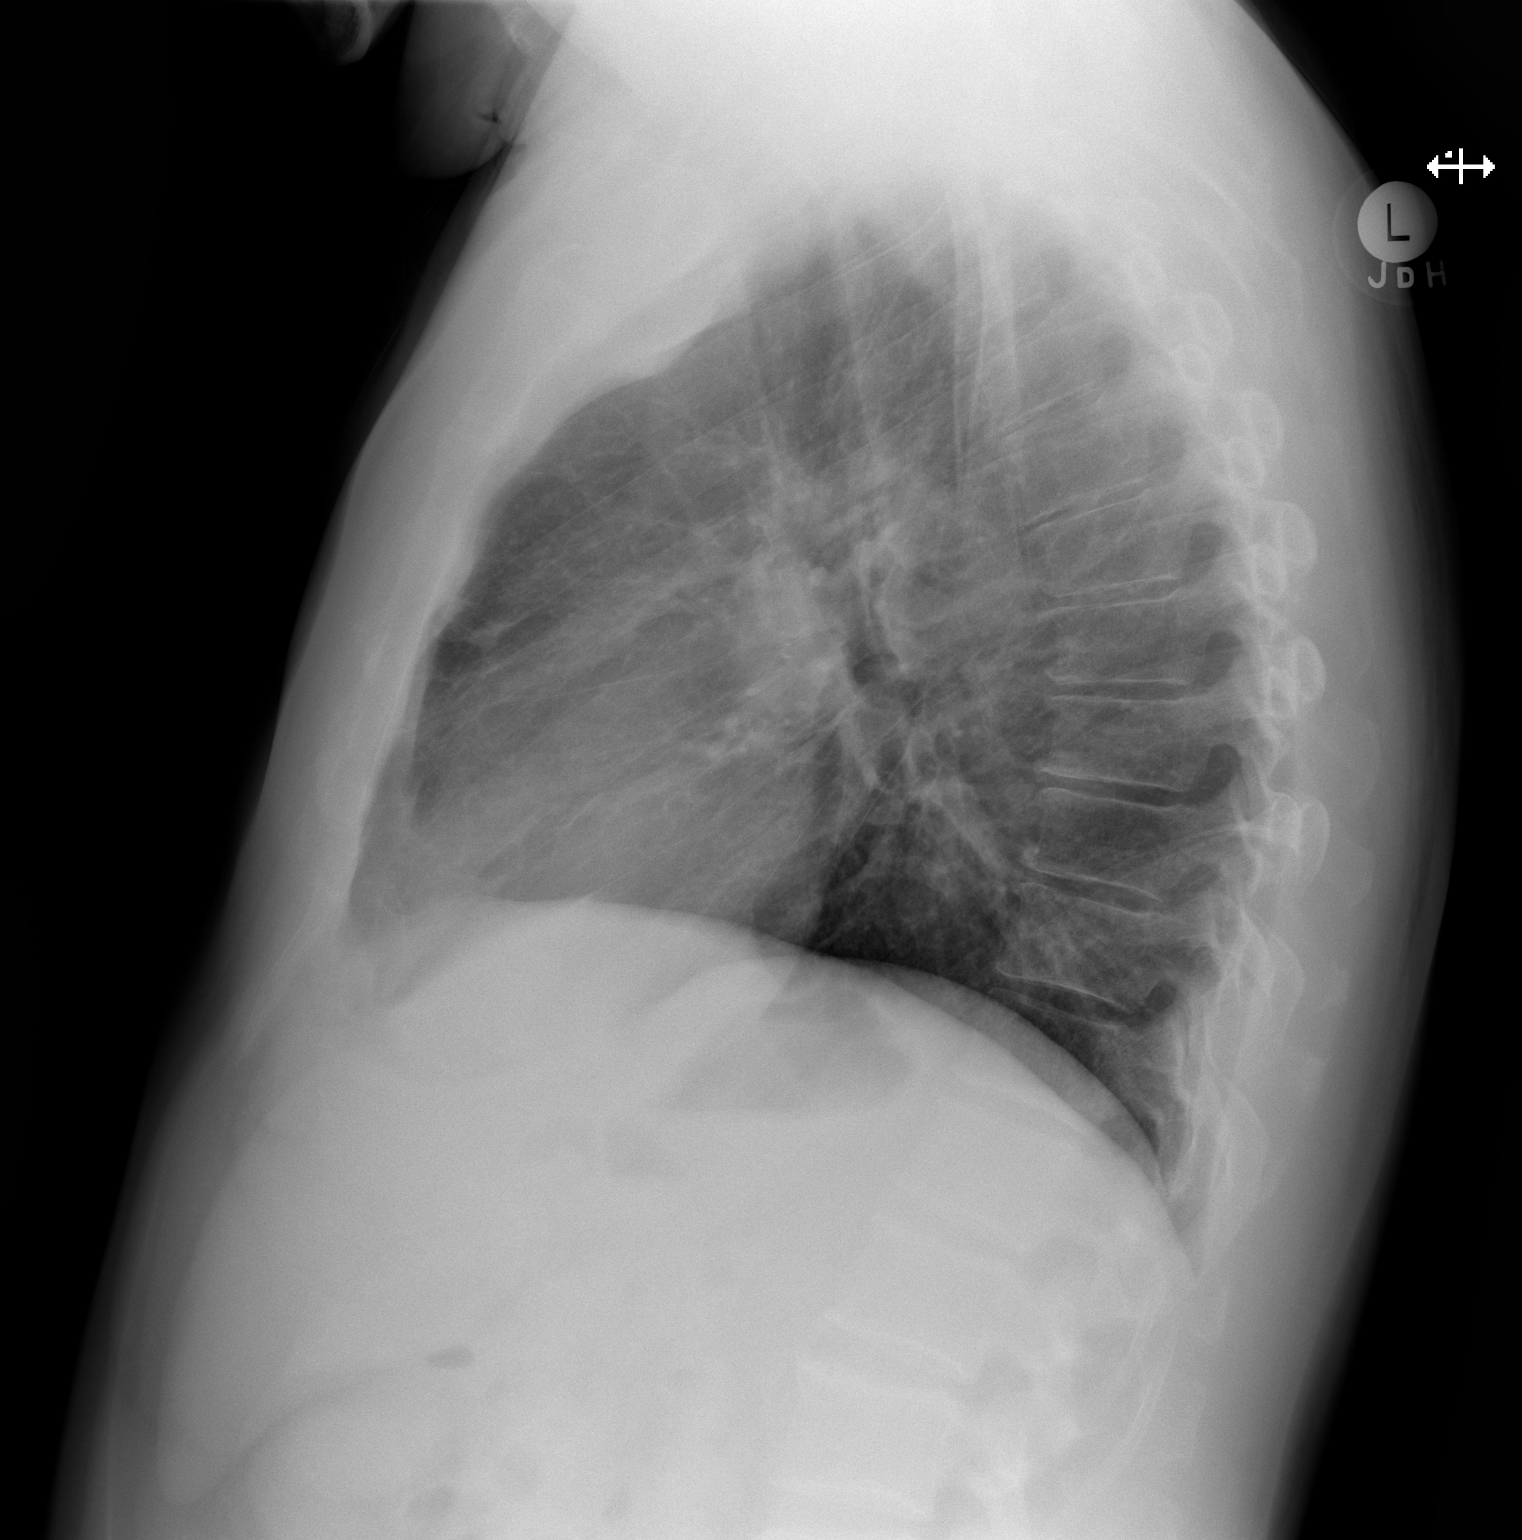

[2 of 2 positions shown; findings below may reference images not displayed]

FINDINGS: Heart and mediastinal contours are within normal limits. No focal
opacities or effusions. No acute bony abnormality.
IMPRESSION: No active cardiopulmonary disease.

## 2020-12-01 ENCOUNTER — Institutional Professional Consult (permissible substitution): Payer: Managed Care, Other (non HMO) | Admitting: Pulmonary Disease

## 2020-12-08 ENCOUNTER — Encounter: Payer: Self-pay | Admitting: Pulmonary Disease

## 2020-12-08 ENCOUNTER — Other Ambulatory Visit: Payer: Self-pay

## 2020-12-08 ENCOUNTER — Ambulatory Visit (INDEPENDENT_AMBULATORY_CARE_PROVIDER_SITE_OTHER): Payer: Managed Care, Other (non HMO) | Admitting: Pulmonary Disease

## 2020-12-08 VITALS — BP 132/78 | HR 74 | Temp 98.1°F | Ht 69.0 in | Wt 233.0 lb

## 2020-12-08 DIAGNOSIS — G4733 Obstructive sleep apnea (adult) (pediatric): Secondary | ICD-10-CM | POA: Diagnosis not present

## 2020-12-08 DIAGNOSIS — J310 Chronic rhinitis: Secondary | ICD-10-CM

## 2020-12-08 DIAGNOSIS — Z9989 Dependence on other enabling machines and devices: Secondary | ICD-10-CM | POA: Diagnosis not present

## 2020-12-08 NOTE — Patient Instructions (Signed)
Will arrange for new auto CPAP machine, new supplies, and new heated tubing  Follow up in 6 months

## 2020-12-08 NOTE — Progress Notes (Signed)
[facility]  Chief Complaint  Patient presents with   Consult    Sleep Consult-wearing CPAP    Past Surgical History:  He  has a past surgical history that includes Uvulopalatopharyngoplasty (1994); Adenoidectomy (1994); Colonoscopy (10/15/2014); and Polypectomy.  Past Medical History:  Allergies, GERD, HLD, Hypothyroidism  Constitutional:  BP 132/78 (BP Location: Left Arm, Cuff Size: Large)   Pulse 74   Temp 98.1 F (36.7 C) (Temporal)   Ht 5\' 9"  (1.753 m)   Wt 233 lb (105.7 kg)   SpO2 100%   BMI 34.41 kg/m   Brief Summary:  Brian Elliott is a 55 y.o. male smoker with obstructive sleep apnea.  He had UPPP in 1994.      Subjective:   He was seen previously by Dr. Gwenette Greet and Dr. Elsworth Soho.  Last seen in 2017.  Got new auto CPAP through [facility] in 2017.  He needs new machine and new supplies.  Used [facility] previously.  He uses nasal mask.  Gets sinus congestion in the morning.  Has been using flonase.  Prior to starting CPAP he was snoring, and had disrupted sleep and would feel tired during the day.  He had UPPP surgery in the 1990's because of recurrent upper respiratory infections and needed his tonsils taken out at the time also.    He goes to sleep at 830 to 930 pm.  He falls asleep 10 minutes.  He wakes up some times to use the bathroom.  He gets out of bed at 5 am.  He feels okay in the morning.  He denies morning headache.  He does not use anything to help him fall sleep or stay awake.  He denies sleep walking, sleep talking, bruxism, or nightmares.  There is no history of restless legs.  He denies sleep hallucinations, sleep paralysis, or cataplexy.  The Epworth score is 12 out of 24.     Physical Exam:   Appearance - well kempt   ENMT - no sinus tenderness, no oral exudate, no LAN, Mallampati 2 airway, no stridor, high arched palate, s/p UPPP  Respiratory - equal breath sounds bilaterally, no wheezing or rales  CV -  s1s2 regular rate and rhythm, no murmurs  Ext - no clubbing, no edema  Skin - no rashes  Psych - normal mood and affect   Sleep Tests:  PSG 07/14/09 >> AHI 27, SpO2 low 86% HST 04/28/15 >> AHI 11, SpO2 low 82%  Social History:  He  reports that he has been smoking cigarettes and cigars. He has a 30.00 pack-year smoking history. He has never used smokeless tobacco. He reports current alcohol use of about 2.0 standard drinks per week. He reports that he does not use drugs.  Family History:  His family history includes Colon polyps in his father; Emphysema in his father; Liver cancer in his maternal grandmother and paternal grandfather; Pancreatic cancer in his brother; Prostate cancer in his maternal grandfather.     Assessment/Plan:   Obstructive sleep apnea. - reviewed his prior sleep studies - discussed different treatment options for sleep apnea - he is compliant with CPAP therapy and reports benefit from therapy - has used [facility] before for supplies - his machine is more than 57 yrs old and will need to be replaced - will arrange for [employer] S11 auto CPAP with range 5 to 15 cm H2O with heated tubing - discussed the issues with using SoClean device and possible ozone exposure  CPAP rhinitis. - advised him to try using nasal irrigation - continue flonase prn  Time Spent Involved in Patient Care on Day of Examination:  32 minutes  Follow up:   Patient Instructions  Will arrange for new auto CPAP machine, new supplies, and new heated tubing  Follow up in 6 months  Medication List:   Allergies as of 12/08/2020   No Known Allergies      Medication List        Accurate as of December 08, 2020  9:57 AM. If you have any questions, ask your nurse or doctor.          STOP taking these medications    esomeprazole 40 MG capsule Commonly known as: NEXIUM Stopped by: Chesley Mires, MD       TAKE these medications    FISH OIL PO Take by mouth.   levothyroxine  150 MCG tablet Commonly known as: SYNTHROID Take 150 mcg by mouth daily.   MAGNESIUM PO Take 200 mg by mouth daily.   multivitamin tablet Take 1 tablet by mouth daily.   omeprazole 40 MG capsule Commonly known as: PRILOSEC Take 40 mg by mouth daily.   simvastatin 20 MG tablet Commonly known as: ZOCOR Take 20 mg by mouth daily.   zinc gluconate 50 MG tablet Take 50 mg by mouth daily.        Signature:  Chesley Mires, MD [place] Pager - [phone] 12/08/2020, 9:57 AM

## 2021-10-26 ENCOUNTER — Other Ambulatory Visit: Payer: Self-pay | Admitting: Internal Medicine

## 2021-10-26 DIAGNOSIS — R7989 Other specified abnormal findings of blood chemistry: Secondary | ICD-10-CM

## 2021-10-28 ENCOUNTER — Ambulatory Visit
Admission: RE | Admit: 2021-10-28 | Discharge: 2021-10-28 | Disposition: A | Discharge status: Home / Self Care | Payer: Managed Care, Other (non HMO) | Source: Ambulatory Visit | Attending: Internal Medicine

## 2021-10-28 DIAGNOSIS — R7989 Other specified abnormal findings of blood chemistry: Secondary | ICD-10-CM

## 2022-08-03 ENCOUNTER — Encounter (HOSPITAL_COMMUNITY): Payer: Self-pay | Admitting: Cardiology

## 2022-08-03 ENCOUNTER — Observation Stay (HOSPITAL_COMMUNITY)
Admission: AD | Admit: 2022-08-03 | Discharge: 2022-08-04 | Disposition: A | Discharge status: Home / Self Care | Payer: 59 | Source: Ambulatory Visit | Attending: Cardiology | Admitting: Cardiology

## 2022-08-03 ENCOUNTER — Ambulatory Visit: Payer: No Typology Code available for payment source | Admitting: Cardiology

## 2022-08-03 ENCOUNTER — Other Ambulatory Visit: Payer: Self-pay

## 2022-08-03 ENCOUNTER — Encounter (HOSPITAL_COMMUNITY): Payer: Self-pay

## 2022-08-03 ENCOUNTER — Observation Stay (HOSPITAL_COMMUNITY): Payer: 59

## 2022-08-03 ENCOUNTER — Encounter: Payer: Self-pay | Admitting: Cardiology

## 2022-08-03 VITALS — BP 132/82 | HR 67 | Resp 18 | Ht 69.0 in | Wt 233.6 lb

## 2022-08-03 DIAGNOSIS — I200 Unstable angina: Secondary | ICD-10-CM

## 2022-08-03 DIAGNOSIS — E039 Hypothyroidism, unspecified: Secondary | ICD-10-CM | POA: Diagnosis not present

## 2022-08-03 DIAGNOSIS — Z87891 Personal history of nicotine dependence: Secondary | ICD-10-CM

## 2022-08-03 DIAGNOSIS — E669 Obesity, unspecified: Secondary | ICD-10-CM | POA: Insufficient documentation

## 2022-08-03 DIAGNOSIS — I25720 Atherosclerosis of autologous artery coronary artery bypass graft(s) with unstable angina pectoris: Secondary | ICD-10-CM | POA: Diagnosis present

## 2022-08-03 DIAGNOSIS — I10 Essential (primary) hypertension: Secondary | ICD-10-CM

## 2022-08-03 DIAGNOSIS — F17210 Nicotine dependence, cigarettes, uncomplicated: Secondary | ICD-10-CM | POA: Insufficient documentation

## 2022-08-03 DIAGNOSIS — E782 Mixed hyperlipidemia: Secondary | ICD-10-CM

## 2022-08-03 DIAGNOSIS — E7800 Pure hypercholesterolemia, unspecified: Secondary | ICD-10-CM | POA: Insufficient documentation

## 2022-08-03 DIAGNOSIS — Z7982 Long term (current) use of aspirin: Secondary | ICD-10-CM | POA: Insufficient documentation

## 2022-08-03 DIAGNOSIS — G4733 Obstructive sleep apnea (adult) (pediatric): Secondary | ICD-10-CM

## 2022-08-03 DIAGNOSIS — Z79899 Other long term (current) drug therapy: Secondary | ICD-10-CM | POA: Insufficient documentation

## 2022-08-03 LAB — HIV ANTIBODY (ROUTINE TESTING W REFLEX): HIV Screen 4th Generation wRfx: NONREACTIVE

## 2022-08-03 LAB — TSH: TSH: 2.817 u[IU]/mL (ref 0.350–4.500)

## 2022-08-03 LAB — COMPREHENSIVE METABOLIC PANEL
ALT: 30 U/L (ref 0–44)
AST: 29 U/L (ref 15–41)
Albumin: 4.2 g/dL (ref 3.5–5.0)
Alkaline Phosphatase: 52 U/L (ref 38–126)
Anion gap: 10 (ref 5–15)
BUN: 17 mg/dL (ref 6–20)
CO2: 24 mmol/L (ref 22–32)
Calcium: 9.5 mg/dL (ref 8.9–10.3)
Chloride: 105 mmol/L (ref 98–111)
Creatinine, Ser: 1.03 mg/dL (ref 0.61–1.24)
GFR, Estimated: >60 mL/min (ref >60)
Glucose, Bld: 118 mg/dL — ABNORMAL HIGH (ref 70–99)
Potassium: 3.7 mmol/L (ref 3.5–5.1)
Sodium: 139 mmol/L (ref 135–145)
Total Bilirubin: 0.6 mg/dL (ref 0.3–1.2)
Total Protein: 7.7 g/dL (ref 6.5–8.1)

## 2022-08-03 LAB — HEPARIN LEVEL (UNFRACTIONATED): Heparin Unfractionated: 0.35 IU/mL (ref 0.30–0.70)

## 2022-08-03 LAB — TROPONIN I (HIGH SENSITIVITY)
Troponin I (High Sensitivity): 5 ng/L (ref <18)
Troponin I (High Sensitivity): 7 ng/L (ref <18)

## 2022-08-03 LAB — MAGNESIUM: Magnesium: 2.2 mg/dL (ref 1.7–2.4)

## 2022-08-03 LAB — BRAIN NATRIURETIC PEPTIDE: B Natriuretic Peptide: 6.1 pg/mL (ref 0.0–100.0)

## 2022-08-03 MED ORDER — FAMOTIDINE 20 MG PO TABS
20.0000 mg | ORAL_TABLET | Freq: Every day | ORAL | Status: DC
  Administered 2022-08-04: 20 mg via ORAL
  Filled 2022-08-03: qty 1

## 2022-08-03 MED ORDER — ASPIRIN 300 MG RE SUPP
300.0000 mg | RECTAL | Status: AC
  Filled 2022-08-03: qty 1

## 2022-08-03 MED ORDER — SODIUM CHLORIDE 0.9% FLUSH: 3.0000 mL | INTRAVENOUS | Status: DC | PRN

## 2022-08-03 MED ORDER — ADULT MULTIVITAMIN W/MINERALS CH
1.0000 | ORAL_TABLET | Freq: Every day | ORAL | Status: DC
  Administered 2022-08-03: 1 via ORAL
  Filled 2022-08-03: qty 1

## 2022-08-03 MED ORDER — AMLODIPINE BESYLATE 2.5 MG PO TABS
2.5000 mg | ORAL_TABLET | Freq: Every day | ORAL | Status: DC
  Administered 2022-08-04: 2.5 mg via ORAL
  Filled 2022-08-03 (×2): qty 1

## 2022-08-03 MED ORDER — LORATADINE 10 MG PO TABS
10.0000 mg | ORAL_TABLET | Freq: Every day | ORAL | Status: DC
  Administered 2022-08-04: 10 mg via ORAL
  Filled 2022-08-03 (×2): qty 1

## 2022-08-03 MED ORDER — SODIUM CHLORIDE 0.9% FLUSH: 3.0000 mL | Freq: Two times a day (BID) | INTRAVENOUS | Status: DC

## 2022-08-03 MED ORDER — LEVOTHYROXINE SODIUM 75 MCG PO TABS
150.0000 ug | ORAL_TABLET | Freq: Every day | ORAL | Status: DC
  Administered 2022-08-03: 150 ug via ORAL
  Filled 2022-08-03: qty 2

## 2022-08-03 MED ORDER — HEPARIN BOLUS VIA INFUSION
4000.0000 [IU] | Freq: Once | INTRAVENOUS | Status: AC
  Administered 2022-08-03: 4000 [IU] via INTRAVENOUS
  Filled 2022-08-03: qty 4000

## 2022-08-03 MED ORDER — SODIUM CHLORIDE 0.9 % IV SOLN: 250.0000 mL | INTRAVENOUS | Status: DC | PRN

## 2022-08-03 MED ORDER — MAGNESIUM OXIDE -MG SUPPLEMENT 400 (240 MG) MG PO TABS
400.0000 mg | ORAL_TABLET | Freq: Every day | ORAL | Status: DC
  Administered 2022-08-03: 200 mg via ORAL
  Administered 2022-08-04: 400 mg via ORAL
  Filled 2022-08-03 (×2): qty 1

## 2022-08-03 MED ORDER — ASPIRIN 81 MG PO CHEW
81.0000 mg | CHEWABLE_TABLET | ORAL | Status: AC
  Administered 2022-08-04: 81 mg via ORAL
  Filled 2022-08-03: qty 1

## 2022-08-03 MED ORDER — ASPIRIN 81 MG PO CHEW: 324.0000 mg | CHEWABLE_TABLET | ORAL | Status: AC

## 2022-08-03 MED ORDER — ACETAMINOPHEN 325 MG PO TABS
650.0000 mg | ORAL_TABLET | ORAL | Status: DC | PRN
  Administered 2022-08-04: 650 mg via ORAL
  Filled 2022-08-03: qty 2

## 2022-08-03 MED ORDER — SODIUM CHLORIDE 0.9% FLUSH
3.0000 mL | Freq: Two times a day (BID) | INTRAVENOUS | Status: DC
  Administered 2022-08-03: 3 mL via INTRAVENOUS

## 2022-08-03 MED ORDER — ASPIRIN 81 MG PO TBEC
81.0000 mg | DELAYED_RELEASE_TABLET | Freq: Every day | ORAL | Status: DC
  Filled 2022-08-03: qty 1

## 2022-08-03 MED ORDER — SODIUM CHLORIDE 0.9 % WEIGHT BASED INFUSION
3.0000 mL/kg/h | INTRAVENOUS | Status: AC
  Administered 2022-08-04: 3 mL/kg/h via INTRAVENOUS

## 2022-08-03 MED ORDER — ONDANSETRON HCL 4 MG/2ML IJ SOLN: 4.0000 mg | Freq: Four times a day (QID) | INTRAMUSCULAR | Status: DC | PRN

## 2022-08-03 MED ORDER — SODIUM CHLORIDE 0.9 % WEIGHT BASED INFUSION
1.0000 mL/kg/h | INTRAVENOUS | Status: DC
  Administered 2022-08-04: 1 mL/kg/h via INTRAVENOUS

## 2022-08-03 MED ORDER — HEPARIN (PORCINE) 25000 UT/250ML-% IV SOLN
1250.0000 [IU]/h | INTRAVENOUS | Status: DC
  Administered 2022-08-03: 1100 [IU]/h via INTRAVENOUS
  Administered 2022-08-04: 1250 [IU]/h via INTRAVENOUS
  Filled 2022-08-03 (×2): qty 250

## 2022-08-03 MED ORDER — SIMVASTATIN 20 MG PO TABS
20.0000 mg | ORAL_TABLET | Freq: Every day | ORAL | Status: DC
  Administered 2022-08-03: 20 mg via ORAL
  Filled 2022-08-03: qty 1

## 2022-08-03 MED ORDER — PANTOPRAZOLE SODIUM 40 MG PO TBEC
40.0000 mg | DELAYED_RELEASE_TABLET | Freq: Every day | ORAL | Status: DC
  Administered 2022-08-03: 40 mg via ORAL
  Filled 2022-08-03: qty 1

## 2022-08-03 MED ORDER — NITROGLYCERIN 0.4 MG SL SUBL: 0.4000 mg | SUBLINGUAL_TABLET | SUBLINGUAL | Status: DC | PRN

## 2022-08-03 NOTE — Progress Notes (Addendum)
ANTICOAGULATION CONSULT NOTE - Initial Consult  Pharmacy Consult for heparin Indication: chest pain/ACS  No Known Allergies  Patient Measurements:   Heparin Dosing Weight: 93.7 kg  Vital Signs: BP: 132/82 (05/23 1247) Pulse Rate: 67 (05/23 1247)  Labs: No results for input(s): "HGB", "HCT", "PLT", "APTT", "LABPROT", "INR", "HEPARINUNFRC", "HEPRLOWMOCWT", "CREATININE", "CKTOTAL", "CKMB", "TROPONINIHS" in the last 72 hours.  CrCl cannot be calculated (No successful lab value found.).   Medical History: Past Medical History:  Diagnosis Date   Allergy    seasonal   GERD (gastroesophageal reflux disease)    Hyperlipidemia    Hypothyroid    Sleep apnea    wears cpap    Medications:  Scheduled:   aspirin  324 mg Oral NOW   Or   aspirin  300 mg Rectal NOW   [START ON 08/04/2022] aspirin EC  81 mg Oral Daily   sodium chloride flush  3 mL Intravenous Q12H    Assessment: 57 yo male direct admit from cardiology clinic for evaluation of chest pain x 3 weeks. PMH includes HTN, HLD, OSA. Planning for heart catheterization 5/24. Pharmacy consulted to dose IV heparin for unstable angina. No AC PTA.  Goal of Therapy:  Heparin level 0.3-0.7 units/ml Monitor platelets by anticoagulation protocol: Yes   Plan:  Heparin 4000 units IV bolus x1  Start heparin infusion at 1100 units/hr Check heparin level in 6 hours   Rexford Maus, PharmD, BCPS 08/03/2022 3:57 PM  PM UPDATE: - HL 0.35 (drawn a couple hours early) > therapeutic - Confirmatory heparin level in 6 hours  Rexford Maus, PharmD, BCPS 08/03/2022 9:01 PM

## 2022-08-03 NOTE — Progress Notes (Signed)
ID:  TRAITON LINGG, DOB [birth year], MRN [id number]  PCP:  Charlane Ferretti, DO  Cardiologist:  Tessa Lerner, DO, [facility] (established care 08/03/22)  REASON FOR CONSULT: Chest pain and hyperlipidemia.   REQUESTING PHYSICIAN:  Charlane Ferretti, DO [address] [place],  [place] [address]  Chief Complaint  Patient presents with   Hyperlipidemia   Chest Pain   New Patient (Initial Visit)    Referred by Ronney Lion, NP    HPI  Brian Elliott is a 57 y.o. Caucasian male who presents to the clinic for evaluation of chest pain and hyperlipidemia at the request of Charlane Ferretti, DO. His past medical history and cardiovascular risk factors include: Hypothyroidism, hyperlipidemia, OSA status post UP3 and on CPAP, GERD, erectile dysfunction, former smoker.   Patient is referred to the practice for evaluation of chest pain and hyperlipidemia.  Patient states that approximately 3 to 4 weeks ago after eating a [place] meal he has been having more pronounced heartburn like sensation.  Despite taking his heartburn medications reducing coffee and soda consumption he continues to have the same discomfort.  Chest pain located over the left anterior chest wall, described as a burning-like sensation, intensity at rest is 3 out of 10, with exertion its 8 out of 10, worse with effort related activities, improves with resting.  Still continues to have pain right now 3 out of 10.  For reasons unknown he has not gone to the ED for more expedited evaluation.  No family history of premature coronary disease or sudden cardiac death.  ALLERGIES: No Known Allergies  MEDICATION LIST PRIOR TO VISIT: Current Meds  Medication Sig   acetaminophen (TYLENOL) 500 MG tablet Take 500 mg by mouth every 6 (six) hours as needed.   amLODipine (NORVASC) 2.5 MG tablet Take 2.5 mg by mouth daily.   [employer] ASPIRIN LOW DOSE 81 MG tablet Take 81 mg by mouth daily.   famotidine (PEPCID) 20 MG tablet Take 20 mg by mouth daily.    fexofenadine (ALLEGRA) 180 MG tablet Take 180 mg by mouth daily.   levothyroxine (SYNTHROID) 150 MCG tablet Take 150 mcg by mouth daily.   MAGNESIUM PO Take 200 mg by mouth daily.   Multiple Vitamin (MULTIVITAMIN) tablet Take 1 tablet by mouth daily.   nitroGLYCERIN (NITROSTAT) 0.4 MG SL tablet Place 0.4 mg under the tongue every 5 (five) minutes as needed for chest pain.   Omega-3 Fatty Acids (FISH OIL PO) Take by mouth.   omeprazole (PRILOSEC) 40 MG capsule Take 40 mg by mouth daily.   sildenafil (VIAGRA) 50 MG tablet Take 50 mg by mouth daily as needed for erectile dysfunction.   simvastatin (ZOCOR) 20 MG tablet Take 20 mg by mouth daily.   vitamin E 180 MG (400 UNITS) capsule Take 400 Units by mouth daily.   zinc gluconate 50 MG tablet Take 50 mg by mouth daily.     PAST MEDICAL HISTORY: Past Medical History:  Diagnosis Date   Allergy    seasonal   GERD (gastroesophageal reflux disease)    Hyperlipidemia    Hypothyroid    Sleep apnea    wears cpap    PAST SURGICAL HISTORY: Past Surgical History:  Procedure Laterality Date   ADENOIDECTOMY  1994   COLONOSCOPY  10/15/2014   Russella Dar   POLYPECTOMY     UVULOPALATOPHARYNGOPLASTY  1994    FAMILY HISTORY: The patient family history includes Colon polyps in his father; Emphysema in his father; Heart disease in his  mother; Liver cancer in his maternal grandmother and paternal grandfather; Pancreatic cancer in his brother; Prostate cancer in his maternal grandfather.  SOCIAL HISTORY:  The patient  reports that he has been smoking cigarettes and cigars. He has a 30.00 pack-year smoking history. He has never used smokeless tobacco. He reports current alcohol use of about 2.0 standard drinks of alcohol per week. He reports that he does not use drugs.  REVIEW OF SYSTEMS: Review of Systems  Cardiovascular:  Positive for chest pain. Negative for claudication, dyspnea on exertion, irregular heartbeat, leg swelling, near-syncope,  orthopnea, palpitations, paroxysmal nocturnal dyspnea and syncope.  Respiratory:  Negative for shortness of breath.   Hematologic/Lymphatic: Negative for bleeding problem.  Musculoskeletal:  Negative for muscle cramps and myalgias.  Gastrointestinal:  Positive for heartburn.  Neurological:  Negative for dizziness and light-headedness.    PHYSICAL EXAM:    08/03/2022   12:47 PM 12/08/2020    9:24 AM 11/27/2019   10:00 AM  Vitals with BMI  Height 5\' 9"  5\' 9"    Weight 233 lbs 10 oz 233 lbs   BMI 34.48 34.39   Systolic 132 132 161  Diastolic 82 78 72  Pulse 67 74 62    Physical Exam  Constitutional: No distress.  Age appropriate, hemodynamically stable.   Neck: No JVD present.  Cardiovascular: Normal rate, regular rhythm, S1 normal, S2 normal, intact distal pulses and normal pulses. Exam reveals no gallop, no S3 and no S4.  No murmur heard. Pulses:      Radial pulses are 2+ on the right side and 2+ on the left side.       Dorsalis pedis pulses are 2+ on the right side and 2+ on the left side.       Posterior tibial pulses are 2+ on the right side and 2+ on the left side.  Pulmonary/Chest: Effort normal and breath sounds normal. No stridor. He has no wheezes. He has no rales.  Abdominal: Soft. Bowel sounds are normal. He exhibits no distension. There is no abdominal tenderness.  Musculoskeletal:        General: No edema.     Cervical back: Neck supple.  Neurological: He is alert and oriented to person, place, and time. He has intact cranial nerves (2-12).  Skin: Skin is warm and moist.   CARDIAC DATABASE: EKG: Aug 03, 2022: Normal sinus rhythm, 66 bpm, without underlying ischemia or injury pattern.  Echocardiogram: No results found for this or any previous visit from the past 1095 days.    Stress Testing: No results found for this or any previous visit from the past 1095 days.   Heart Catheterization: None  LABORATORY DATA:  External Labs: Collected: October 13, 2021 Cholesterol 201, HDL 45, LDL 117, non-HDL 156, triglycerides 195  Collected: Jul 27, 2022 provided by the patient. Hemoglobin 14.4, hematocrit 43.8% eGFR 62.2. Sodium 138, potassium 4.3, chloride 104, bicarb 25. AST 28, ALT 232, alkaline phosphatase 58  IMPRESSION:    ICD-10-CM   1. Unstable angina (HCC)  I20.0     2. Benign hypertension  I10 EKG 12-Lead    3. Mixed hyperlipidemia  E78.2     4. Angina pectoris (HCC)  I20.9        RECOMMENDATIONS: FARAJI VARDEMAN is a 57 y.o. Caucasian male whose past medical history and cardiac risk factors include: HTN, Hypothyroidism, hyperlipidemia, OSA status post UP3 and on CPAP, GERD, erectile dysfunction, former smoker.   Unstable angina (HCC) Has been experiencing crescendo  chest pain with effort related activities for the last 3 weeks. No significant change in symptoms with heartburn treatment and reducing caffeinated products EKG does not show myocardial injury pattern. Has multiple cardiovascular risk factors as noted above Has rest pain 3 out of 10. He is high risk for stress test and recommend invasive angiography for concerns for unstable angina. We discussed elective admission to the hospital versus elective angiography.  Given the prolonged symptoms, continued rest pain shared decision was to electively admit him to the hospital tonight start him on IV heparin n.p.o. after midnight for heart catheterization.  The procedure of left heart catheterization with possible intervention was explained to the patient in detail.  The indication, alternatives, risks and benefits were reviewed.  Complications include but not limited to bleeding, infection, vascular injury, stroke, myocardial infarction, arrhythmia (requiring medical or cardiopulmonary resuscitation), kidney injury (requiring short-term or long-term hemodialysis), radiation-related injury in the case of prolonged fluoroscopy use, emergent cardiac surgery, temporary or  permanent pacemaker, and death. The patient  understands the risks of serious complication is 1-2 in 1000 with diagnostic cardiac cath and 1-2% or less with angioplasty/stenting.  The patient voices understanding and provides verbal feedback questions and concerns are addressed to his satisfaction and patient wishes to proceed with coronary angiography with possible PCI.   Benign hypertension Office blood pressures are well-controlled. Continue amlodipine for now.  Mixed hyperlipidemia Currently on simvastatin.   He denies myalgia or other side effects. Most recent lipids dated August 2023, independently reviewed as noted above. Currently managed by primary care provider.   FINAL MEDICATION LIST END OF ENCOUNTER: No orders of the defined types were placed in this encounter.   There are no discontinued medications.   Current Outpatient Medications:    acetaminophen (TYLENOL) 500 MG tablet, Take 500 mg by mouth every 6 (six) hours as needed., Disp: , Rfl:    amLODipine (NORVASC) 2.5 MG tablet, Take 2.5 mg by mouth daily., Disp: , Rfl:    [employer]S ASPIRIN LOW DOSE 81 MG tablet, Take 81 mg by mouth daily., Disp: , Rfl:    famotidine (PEPCID) 20 MG tablet, Take 20 mg by mouth daily., Disp: , Rfl:    fexofenadine (ALLEGRA) 180 MG tablet, Take 180 mg by mouth daily., Disp: , Rfl:    levothyroxine (SYNTHROID) 150 MCG tablet, Take 150 mcg by mouth daily., Disp: , Rfl:    MAGNESIUM PO, Take 200 mg by mouth daily., Disp: , Rfl:    Multiple Vitamin (MULTIVITAMIN) tablet, Take 1 tablet by mouth daily., Disp: , Rfl:    nitroGLYCERIN (NITROSTAT) 0.4 MG SL tablet, Place 0.4 mg under the tongue every 5 (five) minutes as needed for chest pain., Disp: , Rfl:    Omega-3 Fatty Acids (FISH OIL PO), Take by mouth., Disp: , Rfl:    omeprazole (PRILOSEC) 40 MG capsule, Take 40 mg by mouth daily., Disp: , Rfl:    sildenafil (VIAGRA) 50 MG tablet, Take 50 mg by mouth daily as needed for erectile dysfunction., Disp:  , Rfl:    simvastatin (ZOCOR) 20 MG tablet, Take 20 mg by mouth daily., Disp: , Rfl:    vitamin E 180 MG (400 UNITS) capsule, Take 400 Units by mouth daily., Disp: , Rfl:    zinc gluconate 50 MG tablet, Take 50 mg by mouth daily., Disp: , Rfl:   Orders Placed This Encounter  Procedures   EKG 12-Lead    There are no Patient Instructions on file for this visit.   --  Continue cardiac medications as reconciled in final medication list. --Return in about 2 weeks (around 08/17/2022) for Follow up, Chest pain. or sooner if needed. --Continue follow-up with your primary care physician regarding the management of your other chronic comorbid conditions.  Patient's questions and concerns were addressed to his satisfaction. He voices understanding of the instructions provided during this encounter.   This note was created using a voice recognition software as a result there may be grammatical errors inadvertently enclosed that do not reflect the nature of this encounter. Every attempt is made to correct such errors.  Tessa Lerner, [place], [facility]  Pager:  [phone] Office: [phone]

## 2022-08-03 NOTE — H&P (Signed)
HISTORY AND PHYSICAL  Patient ID: Brian Elliott MRN: [id number] DOB/AGE: [birth year] 57 y.o.  Admit date: 08/03/2022 Attending physician: Tessa Lerner, DO Primary Physician:  Charlane Ferretti, DO  Chief complaint: chest pain   HPI:  Brian Elliott is a 57 y.o. male who presents with a chief complaint of "chest pain." His past medical history and cardiovascular risk factors include: Hypothyroidism, hyperlipidemia, OSA status post UP3 and on CPAP, GERD, erectile dysfunction, former smoker .  Patient presented to the office for evaluation of chest pain.  He has been having left-sided chest pain ongoing for the last 3 to 4 weeks, intermittent, worse with effort related activities, improves with rest.  Couple days ago when he started mowing his lawn he had to stop secondary to exertional discomfort.  At the time of the evaluation he had resting 3 out of 10 chest pain.  We discussed elective admission for unstable angina versus elective angiography.  Due to the ongoing symptoms, concern for cardiac substrate, multiple risk factors including family history show decision was to admit him electively for angiography to rule out obstructive disease.  ALLERGIES: No Known Allergies  PAST MEDICAL HISTORY: Past Medical History:  Diagnosis Date   Allergy    seasonal   GERD (gastroesophageal reflux disease)    Hyperlipidemia    Hypothyroid    Sleep apnea    wears cpap    PAST SURGICAL HISTORY: Past Surgical History:  Procedure Laterality Date   ADENOIDECTOMY  1994   COLONOSCOPY  10/15/2014   Russella Dar   POLYPECTOMY     UVULOPALATOPHARYNGOPLASTY  1994    FAMILY HISTORY: The patient family history includes Colon polyps in his father; Emphysema in his father; Heart disease in his mother; Liver cancer in his maternal grandmother and paternal grandfather; Pancreatic cancer in his brother; Prostate cancer in his maternal grandfather.   SOCIAL HISTORY:  The patient  reports that he has been  smoking cigarettes and cigars. He has a 30.00 pack-year smoking history. He has never used smokeless tobacco. He reports current alcohol use of about 2.0 standard drinks of alcohol per week. He reports that he does not use drugs.  MEDICATIONS: Current Outpatient Medications  Medication Instructions   acetaminophen (TYLENOL) 500 mg, Oral, Every 6 hours PRN   amLODipine (NORVASC) 2.5 mg, Oral, Daily   [employer] Aspirin Low Dose 81 mg, Oral, Daily   famotidine (PEPCID) 20 mg, Oral, Daily   fexofenadine (ALLEGRA) 180 mg, Oral, Daily   levothyroxine (SYNTHROID) 150 mcg, Oral, Daily   MAGNESIUM PO 200 mg, Oral, 2 times daily   Multiple Vitamin (MULTIVITAMIN) tablet 1 tablet, Oral, Daily   nitroGLYCERIN (NITROSTAT) 0.4 mg, Sublingual, Every 5 min PRN   Omega-3 Fatty Acids (FISH OIL PO) Oral   omeprazole (PRILOSEC) 40 mg, Oral, Daily   sildenafil (VIAGRA) 50 mg, Oral, Daily PRN   simvastatin (ZOCOR) 20 mg, Oral, Daily   vitamin E 400 Units, Oral, Daily   zinc gluconate 50 mg, Oral, 2 times daily     sodium chloride     [START ON 08/04/2022] sodium chloride     Followed by   Melene Muller ON 08/04/2022] sodium chloride     heparin 1,100 Units/hr (08/03/22 1707)    REVIEW OF SYSTEMS: Review of Systems  Cardiovascular:  Positive for chest pain. Negative for claudication, dyspnea on exertion, irregular heartbeat, leg swelling, near-syncope, orthopnea, palpitations, paroxysmal nocturnal dyspnea and syncope.  Respiratory:  Negative for shortness of breath.   Hematologic/Lymphatic: Negative for bleeding problem.  Musculoskeletal:  Negative for muscle cramps and myalgias.  Gastrointestinal:  Positive for heartburn.  Neurological:  Negative for dizziness and light-headedness.    PHYSICAL EXAM:    08/03/2022    4:31 PM 08/03/2022   12:47 PM 12/08/2020    9:24 AM  Vitals with BMI  Height 5\' 9"  5\' 9"  5\' 9"   Weight 233 lbs 7 oz 233 lbs 10 oz 233 lbs  BMI 34.46 34.48 34.39  Systolic 133 132 409  Diastolic  92 82 78  Pulse 79 67 74     Intake/Output Summary (Last 24 hours) at 08/03/2022 1934 Last data filed at 08/03/2022 1707 Gross per 24 hour  Intake 0.27 ml  Output --  Net 0.27 ml    Net IO Since Admission: 0.27 mL [08/03/22 1934]  Physical Exam  Constitutional: No distress.  Age appropriate, hemodynamically stable.   Neck: No JVD present.  Cardiovascular: Normal rate, regular rhythm, S1 normal, S2 normal, intact distal pulses and normal pulses. Exam reveals no gallop, no S3 and no S4.  No murmur heard. Pulses:      Radial pulses are 2+ on the right side and 2+ on the left side.       Dorsalis pedis pulses are 2+ on the right side and 2+ on the left side.       Posterior tibial pulses are 2+ on the right side and 2+ on the left side.  Pulmonary/Chest: Effort normal and breath sounds normal. No stridor. He has no wheezes. He has no rales.  Abdominal: Soft. Bowel sounds are normal. He exhibits no distension. There is no abdominal tenderness.  Musculoskeletal:        General: No edema.     Cervical back: Neck supple.  Neurological: He is alert and oriented to person, place, and time. He has intact cranial nerves (2-12).  Skin: Skin is warm and moist.   RADIOLOGY: Portable chest x-ray 1 view  Result Date: 08/03/2022 CLINICAL DATA:  Chest pain EXAM: PORTABLE CHEST 1 VIEW COMPARISON:  03/08/2018 FINDINGS: The cardiac silhouette, mediastinal and hilar contours are normal. The lungs are clear. No pleural effusions. No pulmonary lesions. No pneumothorax. The bony thorax is intact. IMPRESSION: No acute cardiopulmonary findings. Electronically Signed   By: Rudie Meyer M.D.   On: 08/03/2022 16:21    LABORATORY DATA: No results found for: "WBC", "HGB", "HCT", "MCV", "PLT"  Recent Labs  Lab 08/03/22 1601  NA 139  K 3.7  CL 105  CO2 24  BUN 17  CREATININE 1.03  CALCIUM 9.5  PROT 7.7  BILITOT 0.6  ALKPHOS 52  ALT 30  AST 29  GLUCOSE 118*    Lipid Panel  No results found for:  "CHOL", "HDL", "LDLCALC", "LDLDIRECT", "TRIG", "CHOLHDL"  BNP (last 3 results) Recent Labs    08/03/22 1601  BNP 6.1    HEMOGLOBIN A1C No results found for: "HGBA1C", "MPG"  Cardiac Panel (last 3 results) No results for input(s): "CKTOTAL", "CKMB", "RELINDX" in the last 8760 hours.  Invalid input(s): "TROPONINHS"  No results found for: "CKTOTAL", "CKMB", "CKMBINDEX"   TSH Recent Labs    08/03/22 1601  TSH 2.817      CARDIAC DATABASE: EKG: Aug 03, 2022: Normal sinus rhythm, 66 bpm, without underlying ischemia or injury pattern.  Echocardiogram: Pending   Heart Catheterization: Pending   IMPRESSION & RECOMMENDATIONS: Brian Elliott is a 57 y.o. Caucasian male whose past medical history and cardiovascular risk factors include: HTN, Hypothyroidism, hyperlipidemia, OSA status post  UP3 and on CPAP, GERD, erectile dysfunction, former smoker.  Impression: Unstable angina. Benign essential hypertension. Obstructive sleep apnea on CPAP status post UP3. Hyperlipidemia  Recommendations: Unstable angina: Symptoms ongoing for the last 3 weeks. Continues to have rest pain. Crescendo discomfort with effort related activities and improves with rest. Was given sublingual nitroglycerin tablets by PCP but has not used it. No significant change with treatment of heartburn/PPIs Given his continued symptoms, exertional discomfort, and multiple cardiovascular risk factors, shared decision was to proceed with ischemic workup.  His pretest probability for obstructive disease is high and therefore stress test is not recommended.  We discussed elective admission to the hospital for an expedited evaluation versus outpatient angiography and having a low threshold to go to the ED if symptoms worsen.  After discussing the risks, benefits, and alternatives the shared decision was to electively admit the [facility] for unstable angina and proceed with angiography tomorrow morning.   Sooner if change in clinical status.  The procedure of left heart catheterization with possible intervention was explained to the patient in detail.  The indication, alternatives, risks and benefits were reviewed.  Complications include but not limited to bleeding, infection, vascular injury, stroke, myocardial infarction, arrhythmia (requiring medical or cardiopulmonary resuscitation), kidney injury (requiring short-term or long-term hemodialysis), radiation-related injury in the case of prolonged fluoroscopy use, emergent cardiac surgery, temporary or permanent pacemaker, and death. The patient  understands the risks of serious complication is 1-2 in 1000 with diagnostic cardiac cath and 1-2% or less with angioplasty/stenting.  The patient voices understanding and provides verbal feedback questions and concerns are addressed to his satisfaction and patient wishes to proceed with coronary angiography with possible PCI.  Check BMP, BNP, trend troponins.  Echo will be ordered to evaluate for structural heart disease and left ventricular systolic function.  Benign essential hypertension: Continue home medications. Monitor for now  Mixed hyperlipidemia: Continue home dose of simvastatin. Fasting lipid profile in the morning. Further recommendations to follow.  Consultants: None   Code Status: Full code, discussed in the office   Family Communication: None   Disposition Plan: Home when stable  Total encounter time 64 minutes.  *Total Encounter Time as defined by the Centers for Medicare and Medicaid Services includes, in addition to the face-to-face time of a patient visit (documented in the note above) non-face-to-face time: obtaining and reviewing outside history, ordering and reviewing medications, tests or procedures, care coordination (communications with other health care professionals or caregivers) and documentation in the medical record.   Patient's questions and concerns were  addressed to his satisfaction. He voices understanding of the instructions provided during this encounter.   This note was created using a voice recognition software as a result there may be grammatical errors inadvertently enclosed that do not reflect the nature of this encounter. Every attempt is made to correct such errors.  Delilah Shan [facility]  Pager:  [phone] Office: [phone] 08/03/2022, 7:34 PM

## 2022-08-03 NOTE — Plan of Care (Signed)

## 2022-08-04 ENCOUNTER — Ambulatory Visit (HOSPITAL_COMMUNITY)
Admission: AD | Disposition: A | Discharge status: Home / Self Care | Payer: Self-pay | Source: Ambulatory Visit | Attending: Cardiology

## 2022-08-04 ENCOUNTER — Observation Stay (HOSPITAL_COMMUNITY): Payer: 59

## 2022-08-04 ENCOUNTER — Other Ambulatory Visit (HOSPITAL_COMMUNITY): Payer: Self-pay

## 2022-08-04 DIAGNOSIS — F17210 Nicotine dependence, cigarettes, uncomplicated: Secondary | ICD-10-CM | POA: Diagnosis not present

## 2022-08-04 DIAGNOSIS — E039 Hypothyroidism, unspecified: Secondary | ICD-10-CM | POA: Diagnosis not present

## 2022-08-04 DIAGNOSIS — I25720 Atherosclerosis of autologous artery coronary artery bypass graft(s) with unstable angina pectoris: Secondary | ICD-10-CM | POA: Diagnosis not present

## 2022-08-04 DIAGNOSIS — E7800 Pure hypercholesterolemia, unspecified: Secondary | ICD-10-CM | POA: Diagnosis not present

## 2022-08-04 HISTORY — PX: CORONARY STENT INTERVENTION: CATH118234

## 2022-08-04 HISTORY — PX: LEFT HEART CATH AND CORONARY ANGIOGRAPHY: CATH118249

## 2022-08-04 LAB — CBC
HCT: 38.1 % — ABNORMAL LOW (ref 39.0–52.0)
Hemoglobin: 13 g/dL (ref 13.0–17.0)
MCH: 30.7 pg (ref 26.0–34.0)
MCHC: 34.1 g/dL (ref 30.0–36.0)
MCV: 90.1 fL (ref 80.0–100.0)
Platelets: 246 10*3/uL (ref 150–400)
RBC: 4.23 MIL/uL (ref 4.22–5.81)
RDW: 13.4 % (ref 11.5–15.5)
WBC: 6.5 10*3/uL (ref 4.0–10.5)
nRBC: 0 % (ref 0.0–0.2)

## 2022-08-04 LAB — ECHOCARDIOGRAM COMPLETE
Area-P 1/2: 2.18 cm2
Calc EF: 61.3 %
Height: 69 in
S' Lateral: 3.1 cm
Single Plane A2C EF: 57.4 %
Single Plane A4C EF: 61.4 %
Weight: 3636.71 oz

## 2022-08-04 LAB — BASIC METABOLIC PANEL
Anion gap: 10 (ref 5–15)
BUN: 19 mg/dL (ref 6–20)
CO2: 23 mmol/L (ref 22–32)
Calcium: 8.9 mg/dL (ref 8.9–10.3)
Chloride: 102 mmol/L (ref 98–111)
Creatinine, Ser: 1.09 mg/dL (ref 0.61–1.24)
GFR, Estimated: >60 mL/min (ref >60)
Glucose, Bld: 122 mg/dL — ABNORMAL HIGH (ref 70–99)
Potassium: 3.6 mmol/L (ref 3.5–5.1)
Sodium: 135 mmol/L (ref 135–145)

## 2022-08-04 LAB — LIPID PANEL
Cholesterol: 150 mg/dL (ref 0–200)
HDL: 36 mg/dL — ABNORMAL LOW (ref >40)
LDL Cholesterol: 90 mg/dL (ref 0–99)
Total CHOL/HDL Ratio: 4.2 RATIO
Triglycerides: 122 mg/dL (ref <150)
VLDL: 24 mg/dL (ref 0–40)

## 2022-08-04 LAB — HEMOGLOBIN A1C
Hgb A1c MFr Bld: 5.9 % — ABNORMAL HIGH (ref 4.8–5.6)
Mean Plasma Glucose: 123 mg/dL

## 2022-08-04 LAB — POCT ACTIVATED CLOTTING TIME: Activated Clotting Time: 282 seconds

## 2022-08-04 LAB — LDL CHOLESTEROL, DIRECT: Direct LDL: 97 mg/dL (ref 0–99)

## 2022-08-04 LAB — HEPARIN LEVEL (UNFRACTIONATED)
Heparin Unfractionated: 0.26 IU/mL — ABNORMAL LOW (ref 0.30–0.70)
Heparin Unfractionated: 0.34 IU/mL (ref 0.30–0.70)

## 2022-08-04 LAB — D-DIMER, QUANTITATIVE: D-Dimer, Quant: <0.27 ug/mL-FEU (ref 0.00–0.50)

## 2022-08-04 SURGERY — LEFT HEART CATH AND CORONARY ANGIOGRAPHY
Anesthesia: LOCAL

## 2022-08-04 MED ORDER — HYDRALAZINE HCL 20 MG/ML IJ SOLN: 5.0000 mg | INTRAMUSCULAR | Status: DC | PRN

## 2022-08-04 MED ORDER — LABETALOL HCL 5 MG/ML IV SOLN: 10.0000 mg | INTRAVENOUS | Status: DC | PRN

## 2022-08-04 MED ORDER — VERAPAMIL HCL 2.5 MG/ML IV SOLN
INTRAVENOUS | Status: DC | PRN
  Administered 2022-08-04: 10 mL via INTRA_ARTERIAL

## 2022-08-04 MED ORDER — HEPARIN (PORCINE) IN NACL 1000-0.9 UT/500ML-% IV SOLN
INTRAVENOUS | Status: DC | PRN
  Administered 2022-08-04 (×2): 500 mL

## 2022-08-04 MED ORDER — SODIUM CHLORIDE 0.9 % IV SOLN: 250.0000 mL | INTRAVENOUS | Status: DC | PRN

## 2022-08-04 MED ORDER — SODIUM CHLORIDE 0.9 % WEIGHT BASED INFUSION: 1.0000 mL/kg/h | INTRAVENOUS | Status: DC

## 2022-08-04 MED ORDER — HEPARIN SODIUM (PORCINE) 1000 UNIT/ML IJ SOLN
INTRAMUSCULAR | Status: AC
  Filled 2022-08-04: qty 10

## 2022-08-04 MED ORDER — IOHEXOL 350 MG/ML SOLN
INTRAVENOUS | Status: DC | PRN
  Administered 2022-08-04: 160 mL

## 2022-08-04 MED ORDER — TICAGRELOR 90 MG PO TABS: 90.0000 mg | ORAL_TABLET | Freq: Two times a day (BID) | ORAL | 0 refills | Status: DC

## 2022-08-04 MED ORDER — NITROGLYCERIN IN D5W 200-5 MCG/ML-% IV SOLN
0.0000 ug/min | INTRAVENOUS | Status: DC
  Administered 2022-08-04: 5 ug/min via INTRAVENOUS
  Filled 2022-08-04: qty 250

## 2022-08-04 MED ORDER — SODIUM CHLORIDE 0.9% FLUSH: 3.0000 mL | INTRAVENOUS | Status: DC | PRN

## 2022-08-04 MED ORDER — FENTANYL CITRATE (PF) 100 MCG/2ML IJ SOLN
INTRAMUSCULAR | Status: AC
  Filled 2022-08-04: qty 2

## 2022-08-04 MED ORDER — POTASSIUM CHLORIDE CRYS ER 20 MEQ PO TBCR
40.0000 meq | EXTENDED_RELEASE_TABLET | Freq: Once | ORAL | Status: AC
  Administered 2022-08-04: 40 meq via ORAL
  Filled 2022-08-04: qty 2

## 2022-08-04 MED ORDER — TICAGRELOR 90 MG PO TABS
ORAL_TABLET | ORAL | Status: DC | PRN
  Administered 2022-08-04: 180 mg via ORAL

## 2022-08-04 MED ORDER — NITROGLYCERIN 1 MG/10 ML FOR IR/CATH LAB
INTRA_ARTERIAL | Status: DC | PRN
  Administered 2022-08-04 (×2): 200 ug via INTRACORONARY

## 2022-08-04 MED ORDER — LIDOCAINE HCL (PF) 1 % IJ SOLN
INTRAMUSCULAR | Status: AC
  Filled 2022-08-04: qty 30

## 2022-08-04 MED ORDER — SODIUM CHLORIDE 0.9 % IV BOLUS
INTRAVENOUS | Status: AC | PRN
  Administered 2022-08-04: 500 mL via INTRAVENOUS

## 2022-08-04 MED ORDER — MIDAZOLAM HCL 2 MG/2ML IJ SOLN
INTRAMUSCULAR | Status: AC
  Filled 2022-08-04: qty 2

## 2022-08-04 MED ORDER — TICAGRELOR 90 MG PO TABS
ORAL_TABLET | ORAL | Status: AC
  Filled 2022-08-04: qty 2

## 2022-08-04 MED ORDER — VERAPAMIL HCL 2.5 MG/ML IV SOLN
INTRAVENOUS | Status: AC
  Filled 2022-08-04: qty 2

## 2022-08-04 MED ORDER — LIDOCAINE HCL (PF) 1 % IJ SOLN
INTRAMUSCULAR | Status: DC | PRN
  Administered 2022-08-04: 2 mL

## 2022-08-04 MED ORDER — NITROGLYCERIN 1 MG/10 ML FOR IR/CATH LAB
INTRA_ARTERIAL | Status: AC
  Filled 2022-08-04: qty 10

## 2022-08-04 MED ORDER — METOPROLOL SUCCINATE ER 25 MG PO TB24
25.0000 mg | ORAL_TABLET | Freq: Every day | ORAL | 2 refills | Status: DC
  Filled 2022-08-04: qty 30, 30d supply, fill #0

## 2022-08-04 MED ORDER — FENTANYL CITRATE (PF) 100 MCG/2ML IJ SOLN
INTRAMUSCULAR | Status: DC | PRN
  Administered 2022-08-04: 25 ug via INTRAVENOUS
  Administered 2022-08-04: 50 ug via INTRAVENOUS

## 2022-08-04 MED ORDER — ATORVASTATIN CALCIUM 40 MG PO TABS
40.0000 mg | ORAL_TABLET | Freq: Every day | ORAL | 3 refills | Status: DC
  Filled 2022-08-04: qty 30, 30d supply, fill #0

## 2022-08-04 MED ORDER — MIDAZOLAM HCL 2 MG/2ML IJ SOLN
INTRAMUSCULAR | Status: DC | PRN
  Administered 2022-08-04: 2 mg via INTRAVENOUS

## 2022-08-04 MED ORDER — TICAGRELOR 90 MG PO TABS
90.0000 mg | ORAL_TABLET | Freq: Two times a day (BID) | ORAL | 0 refills | Status: DC
Start: 2022-08-04 — End: 2022-08-25
  Filled 2022-08-04: qty 60, 30d supply, fill #0

## 2022-08-04 MED ORDER — SODIUM CHLORIDE 0.9 % WEIGHT BASED INFUSION: 3.0000 mL/kg/h | INTRAVENOUS | Status: DC

## 2022-08-04 MED ORDER — SODIUM CHLORIDE 0.9% FLUSH: 3.0000 mL | Freq: Two times a day (BID) | INTRAVENOUS | Status: DC

## 2022-08-04 MED ORDER — HEPARIN SODIUM (PORCINE) 1000 UNIT/ML IJ SOLN
INTRAMUSCULAR | Status: DC | PRN
  Administered 2022-08-04: 5000 [IU] via INTRAVENOUS
  Administered 2022-08-04: 4000 [IU] via INTRAVENOUS

## 2022-08-04 SURGICAL SUPPLY — 19 items
BALL SAPPHIRE NC24 3.5X22 (BALLOONS) ×1
BALLN SCOREFLEX 3.0X15 (BALLOONS) ×1
BALLOON SAPPHIRE NC24 3.5X22 (BALLOONS) IMPLANT
BALLOON SCOREFLEX 3.0X15 (BALLOONS) IMPLANT
BAND CMPR LRG ZPHR (HEMOSTASIS) ×1
BAND ZEPHYR COMPRESS 30 LONG (HEMOSTASIS) IMPLANT
CATH OPTITORQUE TIG 4.0 5F (CATHETERS) IMPLANT
CATH VISTA GUIDE 6FR XB3.5 (CATHETERS) IMPLANT
GLIDESHEATH SLEND A-KIT 6F 22G (SHEATH) IMPLANT
GUIDEWIRE INQWIRE 1.5J.035X260 (WIRE) IMPLANT
INQWIRE 1.5J .035X260CM (WIRE) ×1
KIT ENCORE 26 ADVANTAGE (KITS) IMPLANT
KIT HEART LEFT (KITS) ×1 IMPLANT
PACK CARDIAC CATHETERIZATION (CUSTOM PROCEDURE TRAY) ×1 IMPLANT
STENT SYNERGY XD 3.0X38 (Permanent Stent) IMPLANT
SYNERGY XD 3.0X38 (Permanent Stent) ×1 IMPLANT
TRANSDUCER W/STOPCOCK (MISCELLANEOUS) ×1 IMPLANT
TUBING CIL FLEX 10 FLL-RA (TUBING) ×1 IMPLANT
WIRE COUGAR XT STRL 190CM (WIRE) IMPLANT

## 2022-08-04 NOTE — Interval H&P Note (Signed)
History and Physical Interval Note:  08/04/2022 1:52 PM  Brian Elliott  has presented today for surgery, with the diagnosis of unstable angina.  The various methods of treatment have been discussed with the patient and family. After consideration of risks, benefits and other options for treatment, the patient has consented to  Procedure(s): LEFT HEART CATH AND CORONARY ANGIOGRAPHY (N/A) as a surgical intervention.  The patient's history has been reviewed, patient examined, no change in status, stable for surgery.  I have reviewed the patient's chart and labs.  Questions were answered to the patient's satisfaction.     Yates Decamp  Cath Lab Visit (complete for each Cath Lab visit)  Clinical Evaluation Leading to the Procedure:   ACS: Yes.    Non-ACS:    Anginal Classification: CCS IV  Anti-ischemic medical therapy: Minimal Therapy (1 class of medications)  Non-Invasive Test Results: No non-invasive testing performed  Prior CABG: No previous CABG

## 2022-08-04 NOTE — Progress Notes (Signed)
ANTICOAGULATION CONSULT NOTE - Follow Up Consult  Pharmacy Consult for heparin Indication:  USAP  Labs: Recent Labs    08/03/22 1601 08/03/22 1817 08/03/22 2014 08/04/22 0252 08/04/22 1149  HGB  --   --   --  13.0  --   HCT  --   --   --  38.1*  --   PLT  --   --   --  246  --   HEPARINUNFRC  --   --  0.35 0.26* 0.34  CREATININE 1.03  --   --  1.09  --   TROPONINIHS 5 7  --   --   --      Assessment: 57yo male admiited with chest pain Plan for cath today  S/p ECHO with EF 50%  Heparin drip 1250 uts/hr heparin level 0.34 at goal  Cbc stable no bleeding noted   Goal of Therapy:  Heparin level 0.3-0.7 units/ml   Plan:  Continue heparin drip 1250 uts/hr  Daily heparin level and cbc    Leota Sauers Pharm.D. CPP, BCPS Clinical Pharmacist [phone] 08/04/2022 1:45 PM

## 2022-08-04 NOTE — H&P (View-Only) (Signed)
Subjective:  Patient seen and examined at bedside, no events overnight. He is still having chest pain at rest. Plan for cardiac cath later today.  Intake/Output from previous day:  I/O last 3 completed shifts: In: 3.3 [I.V.:3.3] Out: -  No intake/output data recorded. Net IO Since Admission: 3.27 mL [08/04/22 0837]  Blood pressure 119/79, pulse (!) 54, temperature 97.8 F (36.6 C), temperature source Oral, resp. rate 18, height 5' 9" (1.753 m), weight 103.1 kg, SpO2 99 %. Physical Exam HENT:     Head: Normocephalic and atraumatic.  Cardiovascular:     Rate and Rhythm: Normal rate and regular rhythm.     Pulses: Normal pulses.     Heart sounds: Normal heart sounds. No murmur heard. Pulmonary:     Effort: Pulmonary effort is normal.     Breath sounds: Normal breath sounds.  Abdominal:     General: Bowel sounds are normal.  Musculoskeletal:     Right lower leg: No edema.     Left lower leg: No edema.  Skin:    General: Skin is warm and dry.  Neurological:     Mental Status: He is alert.     Lab Results: Lab Results  Component Value Date   NA 135 08/04/2022   K 3.6 08/04/2022   CO2 23 08/04/2022   GLUCOSE 122 (H) 08/04/2022   BUN 19 08/04/2022   CREATININE 1.09 08/04/2022   CALCIUM 8.9 08/04/2022   GFRNONAA >60 08/04/2022    BNP (last 3 results) Recent Labs    08/03/22 1601  BNP 6.1    ProBNP (last 3 results) No results for input(s): "PROBNP" in the last 8760 hours.    Latest Ref Rng & Units 08/04/2022    2:52 AM 08/03/2022    4:01 PM  BMP  Glucose 70 - 99 mg/dL 122  118   BUN 6 - 20 mg/dL 19  17   Creatinine 0.61 - 1.24 mg/dL 1.09  1.03   Sodium 135 - 145 mmol/L 135  139   Potassium 3.5 - 5.1 mmol/L 3.6  3.7   Chloride 98 - 111 mmol/L 102  105   CO2 22 - 32 mmol/L 23  24   Calcium 8.9 - 10.3 mg/dL 8.9  9.5       Latest Ref Rng & Units 08/03/2022    4:01 PM  Hepatic Function  Total Protein 6.5 - 8.1 g/dL 7.7   Albumin 3.5 - 5.0 g/dL 4.2   AST 15 -  41 U/L 29   ALT 0 - 44 U/L 30   Alk Phosphatase 38 - 126 U/L 52   Total Bilirubin 0.3 - 1.2 mg/dL 0.6       Latest Ref Rng & Units 08/04/2022    2:52 AM  CBC  WBC 4.0 - 10.5 K/uL 6.5   Hemoglobin 13.0 - 17.0 g/dL 13.0   Hematocrit 39.0 - 52.0 % 38.1   Platelets 150 - 400 K/uL 246    Lipid Panel     Component Value Date/Time   CHOL 150 08/04/2022 0252   TRIG 122 08/04/2022 0252   HDL 36 (L) 08/04/2022 0252   CHOLHDL 4.2 08/04/2022 0252   VLDL 24 08/04/2022 0252   LDLCALC 90 08/04/2022 0252   LDLDIRECT 97 08/04/2022 0252   Cardiac Panel (last 3 results) No results for input(s): "CKTOTAL", "CKMB", "TROPONINI", "RELINDX" in the last 72 hours.  HEMOGLOBIN A1C Lab Results  Component Value Date   HGBA1C 5.9 (H) 08/03/2022     MPG 123 08/03/2022   TSH Recent Labs    08/03/22 1601  TSH 2.817   Imaging: Imaging results have been reviewed and Portable chest x-ray 1 view  Result Date: 08/03/2022 CLINICAL DATA:  Chest pain EXAM: PORTABLE CHEST 1 VIEW COMPARISON:  03/08/2018 FINDINGS: The cardiac silhouette, mediastinal and hilar contours are normal. The lungs are clear. No pleural effusions. No pulmonary lesions. No pneumothorax. The bony thorax is intact. IMPRESSION: No acute cardiopulmonary findings. Electronically Signed   By: P.  Gallerani M.D.   On: 08/03/2022 16:21    Cardiac Studies:  EKG: Aug 03, 2022: Normal sinus rhythm, 66 bpm, without underlying ischemia or injury pattern.   Echocardiogram: Pending    Heart Catheterization: Pending   No results found for this or any previous visit (from the past 43800 hour(s)).  Scheduled Meds:  amLODipine  2.5 mg Oral Daily   aspirin EC  81 mg Oral Daily   famotidine  20 mg Oral Daily   levothyroxine  150 mcg Oral Daily   loratadine  10 mg Oral Daily   magnesium oxide  400 mg Oral Daily   multivitamin with minerals  1 tablet Oral Daily   pantoprazole  40 mg Oral Daily   simvastatin  20 mg Oral Daily   sodium chloride  flush  3 mL Intravenous Q12H   sodium chloride flush  3 mL Intravenous Q12H   Continuous Infusions:  sodium chloride     sodium chloride     sodium chloride 1 mL/kg/hr (08/04/22 0515)   [START ON 08/05/2022] sodium chloride     Followed by   [START ON 08/05/2022] sodium chloride     heparin 1,250 Units/hr (08/04/22 0414)   nitroGLYCERIN     PRN Meds:.sodium chloride, sodium chloride, acetaminophen, nitroGLYCERIN, ondansetron (ZOFRAN) IV, sodium chloride flush, sodium chloride flush  Assessment  Brian Elliott is a 56 y.o. male patient with past medical history and cardiovascular risk factors include: HTN, Hypothyroidism, hyperlipidemia, OSA status post UP3 and on CPAP, GERD, erectile dysfunction, former smoker.   Impression: Unstable angina. Benign essential hypertension. Obstructive sleep apnea on CPAP status post UP3. Hyperlipidemia  Plan:   Unstable angina: Symptoms ongoing for the last 3 weeks. Continues to have rest pain. I will place on nitro gtt, continue heparin gtt. Heart cath planned for today around 1pm   Benign essential hypertension: Continue home medications. Monitor for now   Mixed hyperlipidemia: Continue home dose of simvastatin. Fasting lipid profile in the morning. Further recommendations to follow.      Jearldean Gutt, DO 08/04/2022, 8:37 AM Office: [phone] Fax: [phone] Pager: [phone]  

## 2022-08-04 NOTE — Discharge Summary (Signed)
Physician Discharge Summary  Patient ID: JAMARQUS MCSHEA MRN: [id number] DOB/AGE: 59/24/1967 59 y.o. Charlane Ferretti, DO   Admit date: 08/03/2022 Discharge date: 08/04/2022  Primary Discharge Diagnosis 1.  Coronary artery disease of the native vessel with unstable angina pectoris SP angioplasty and stenting to proximal and mid LAD with a long 3.0 x 38 mm Synergy XD DES postdilated with a 3.5 [place] balloon. 2.  Hypercholesterolemia 3.  Mild obesity 4.  OSA on CPAP  Significant Diagnostic Studies:  Echocardiogram 08/04/2022: 1. Left ventricular ejection fraction, by estimation, is 55 to 60%. The left ventricle has normal function. The left ventricle has no regional wall motion abnormalities. Left ventricular diastolic parameters were normal.  2. Right ventricular systolic function is normal. The right ventricular size is upper limit of normal. There is normal pulmonary artery systolic pressure. The estimated right ventricular systolic pressure is 29.5 mmHg.  3. The mitral valve is normal in structure. No evidence of mitral valve regurgitation. No evidence of mitral stenosis.  4. The aortic valve is tricuspid. Aortic valve regurgitation is not visualized. No aortic stenosis is present.  5. The inferior vena cava is dilated in size with >50% respiratory variability, suggesting right atrial pressure of 8 mmHg.  6. Rhythm strip during this exam demonstrates sinus bradycardia.  Left Heart Catheterization 08/04/22:  LV: 129/6, EDP 16 mmHg.  Ao 121/62, mean 90 mmHg.  No pressure gradient across the aortic valve. LVEF 55% without wall motion abnormality. LM: Large-caliber vessel.  Smooth and normal. LAD: Large-caliber vessel.  Gives origin to small-sized D1 and a moderate to large size D2.  The proximal LAD from the origin of the septal perforator 1 all the way to the mid segment has a high-grade 99% stenosis followed by a tandem 80% stenosis of the origin of D2.  TIMI II flow.  Diagonals are widely  patent and normal. Cx: Moderate caliber vessel, smooth and normal. RI: Very large caliber vessel.  Smooth and normal. RCA: Dominant.  Smooth and normal.     Intervention data: Successful PTCA and stenting of the proximal and mid LAD with implantation of  3.0 x 38 mm Synergy XD DES postdilated with a 3.5 mm [place] balloon at 16 atmospheric pressure.  Stenosis reduced from 99% to 0% with TIMI II to TIMI-3 flow at the end of the procedure.   Recommendation: Patient will need DAPT for 1 year in view of ACS.  Risk factor modification indicated.  Portable chest x-ray 1 view  08/03/2022 CLINICAL DATA:  Chest pain EXAM: PORTABLE CHEST 1 VIEW COMPARISON:  03/08/2018 FINDINGS: The cardiac silhouette, mediastinal and hilar contours are normal. The lungs are clear. No pleural effusions. No pulmonary lesions. No pneumothorax. The bony thorax is intact.   Hospital Course: CAMILLUS KALL is a 57 y.o. male  patient with hypercholesterolemia admitted to the hospital after the visit to our office with symptoms suggestive of unstable angina, admitted to the hospital and started on IV heparin, myocardial infarction was ruled out.  He was scheduled for cardiac catheterization.  Recommendations on discharge: Patient be discharged on DAPT, for a period of at least 1 year in view of unstable angina pectoris.  His statin therapy was changed to high intensity statin.  Low-dose beta-blocker therapy was started.  Postprocedure, patient had no complications and no hematoma, ambulated in the hallway without any chest discomfort.  He will be followed closely in the office.  Discharge Exam:    08/04/2022    4:57 PM 08/04/2022  3:30 PM 08/04/2022    3:10 PM  Vitals with BMI  Systolic 125 134 962  Diastolic 67 80 81  Pulse 60 58 0   Physical Exam Constitutional:      Appearance: He is obese.  Neck:     Vascular: No carotid bruit or JVD.  Cardiovascular:     Rate and Rhythm: Normal rate and regular rhythm.      Pulses: Intact distal pulses.     Heart sounds: Normal heart sounds. No murmur heard.    No gallop.  Pulmonary:     Effort: Pulmonary effort is normal.     Breath sounds: Normal breath sounds.  Abdominal:     General: Bowel sounds are normal.     Palpations: Abdomen is soft.  Musculoskeletal:     Right lower leg: No edema.     Left lower leg: No edema.    Labs:   Lab Results  Component Value Date   WBC 6.5 08/04/2022   HGB 13.0 08/04/2022   HCT 38.1 (L) 08/04/2022   MCV 90.1 08/04/2022   PLT 246 08/04/2022    Recent Labs  Lab 08/03/22 1601 08/04/22 0252  NA 139 135  K 3.7 3.6  CL 105 102  CO2 24 23  BUN 17 19  CREATININE 1.03 1.09  CALCIUM 9.5 8.9  PROT 7.7  --   BILITOT 0.6  --   ALKPHOS 52  --   ALT 30  --   AST 29  --   GLUCOSE 118* 122*   Lipid Panel     Component Value Date/Time   CHOL 150 08/04/2022 0252   TRIG 122 08/04/2022 0252   HDL 36 (L) 08/04/2022 0252   CHOLHDL 4.2 08/04/2022 0252   VLDL 24 08/04/2022 0252   LDLCALC 90 08/04/2022 0252   BNP (last 3 results) Recent Labs    08/03/22 1601  BNP 6.1   HEMOGLOBIN A1C Lab Results  Component Value Date   HGBA1C 5.9 (H) 08/03/2022   MPG 123 08/03/2022   Cardiac Panel (last 3 results) Recent Labs    08/03/22 1601 08/03/22 1817  TROPONINIHS 5 7   TSH Recent Labs    08/03/22 1601  TSH 2.817   FOLLOW UP PLANS AND APPOINTMENTS Discharge Instructions     AMB Referral to Cardiac Rehabilitation - Phase II   Complete by: As directed    Unstable angina   Diagnosis:  Coronary Stents Other     After initial evaluation and assessments completed: Virtual Based Care may be provided alone or in conjunction with Phase 2 Cardiac Rehab based on patient barriers.: Yes   Intensive Cardiac Rehabilitation (ICR) [facility] location only OR Traditional Cardiac Rehabilitation (TCR) *If criteria for ICR are not met will enroll in TCR [facility] only): Yes      Allergies as of 08/04/2022   No Known Allergies       Medication List     STOP taking these medications    sildenafil 50 MG tablet Commonly known as: VIAGRA   simvastatin 20 MG tablet Commonly known as: ZOCOR       TAKE these medications    acetaminophen 500 MG tablet Commonly known as: TYLENOL Take 500 mg by mouth every 6 (six) hours as needed for moderate pain.   amLODipine 2.5 MG tablet Commonly known as: NORVASC Take 2.5 mg by mouth daily.   atorvastatin 40 MG tablet Commonly known as: Lipitor Take 1 tablet (40 mg total) by mouth daily.  Brilinta 90 MG Tabs tablet Generic drug: ticagrelor Take 1 tablet (90 mg total) by mouth 2 (two) times daily.   [employer] Aspirin Low Dose 81 MG tablet Generic drug: aspirin EC Take 81 mg by mouth daily.   famotidine 20 MG tablet Commonly known as: PEPCID Take 20 mg by mouth daily.   fexofenadine 180 MG tablet Commonly known as: ALLEGRA Take 180 mg by mouth daily.   FISH OIL PO Take 1 capsule by mouth daily.   levothyroxine 150 MCG tablet Commonly known as: SYNTHROID Take 150 mcg by mouth daily.   MAGNESIUM PO Take 200 mg by mouth in the morning and at bedtime.   metoprolol succinate 25 MG 24 hr tablet Commonly known as: TOPROL-XL Take 1 tablet (25 mg total) by mouth daily. Take with or immediately following a meal.   multivitamin tablet Take 1 tablet by mouth daily.   nitroGLYCERIN 0.4 MG SL tablet Commonly known as: NITROSTAT Place 0.4 mg under the tongue every 5 (five) minutes as needed for chest pain.   omeprazole 40 MG capsule Commonly known as: PRILOSEC Take 40 mg by mouth daily.   vitamin E 180 MG (400 UNITS) capsule Take 400 Units by mouth daily.   zinc gluconate 50 MG tablet Take 50 mg by mouth in the morning and at bedtime.        Follow-up Information     Tessa Lerner, DO Follow up on 08/22/2022.   Specialties: Cardiology, Vascular Surgery Why: 08/22/2022 1:45 PM. Bring all your medications to the office visit Contact information: [address] [place] [address] [phone]                  Yates Decamp, MD, [facility] 08/04/2022, 10:01 PM Office: [phone]

## 2022-08-04 NOTE — Progress Notes (Signed)
ANTICOAGULATION CONSULT NOTE - Follow Up Consult  Pharmacy Consult for heparin Indication:  USAP  Labs: Recent Labs    08/03/22 1601 08/03/22 1817 08/03/22 2014 08/04/22 0252  HGB  --   --   --  13.0  HCT  --   --   --  38.1*  PLT  --   --   --  246  HEPARINUNFRC  --   --  0.35 0.26*  CREATININE 1.03  --   --  1.09  TROPONINIHS 5 7  --   --     Assessment: 56yo male subtherapeutic on heparin after one level at low end of goal; no infusion issues or signs of bleeding per RN.  Goal of Therapy:  Heparin level 0.3-0.7 units/ml   Plan:  Increase heparin infusion by 1-2 units/kg/hr to 1250 units/hr. Check level in 6 hours.   Brian Elliott, PharmD, BCPS 08/04/2022 3:45 AM

## 2022-08-04 NOTE — Progress Notes (Signed)
Subjective:  Patient seen and examined at bedside, no events overnight. He is still having chest pain at rest. Plan for cardiac cath later today.  Intake/Output from previous day:  I/O last 3 completed shifts: In: 3.3 [I.V.:3.3] Out: -  No intake/output data recorded. Net IO Since Admission: 3.27 mL [08/04/22 0837]  Blood pressure 119/79, pulse (!) 54, temperature 97.8 F (36.6 C), temperature source Oral, resp. rate 18, height 5\' 9"  (1.753 m), weight 103.1 kg, SpO2 99 %. Physical Exam HENT:     Head: Normocephalic and atraumatic.  Cardiovascular:     Rate and Rhythm: Normal rate and regular rhythm.     Pulses: Normal pulses.     Heart sounds: Normal heart sounds. No murmur heard. Pulmonary:     Effort: Pulmonary effort is normal.     Breath sounds: Normal breath sounds.  Abdominal:     General: Bowel sounds are normal.  Musculoskeletal:     Right lower leg: No edema.     Left lower leg: No edema.  Skin:    General: Skin is warm and dry.  Neurological:     Mental Status: He is alert.     Lab Results: Lab Results  Component Value Date   NA 135 08/04/2022   K 3.6 08/04/2022   CO2 23 08/04/2022   GLUCOSE 122 (H) 08/04/2022   BUN 19 08/04/2022   CREATININE 1.09 08/04/2022   CALCIUM 8.9 08/04/2022   GFRNONAA >60 08/04/2022    BNP (last 3 results) Recent Labs    08/03/22 1601  BNP 6.1    ProBNP (last 3 results) No results for input(s): "PROBNP" in the last 8760 hours.    Latest Ref Rng & Units 08/04/2022    2:52 AM 08/03/2022    4:01 PM  BMP  Glucose 70 - 99 mg/dL 161  096   BUN 6 - 20 mg/dL 19  17   Creatinine 0.45 - 1.24 mg/dL 4.09  8.11   Sodium 914 - 145 mmol/L 135  139   Potassium 3.5 - 5.1 mmol/L 3.6  3.7   Chloride 98 - 111 mmol/L 102  105   CO2 22 - 32 mmol/L 23  24   Calcium 8.9 - 10.3 mg/dL 8.9  9.5       Latest Ref Rng & Units 08/03/2022    4:01 PM  Hepatic Function  Total Protein 6.5 - 8.1 g/dL 7.7   Albumin 3.5 - 5.0 g/dL 4.2   AST 15 -  41 U/L 29   ALT 0 - 44 U/L 30   Alk Phosphatase 38 - 126 U/L 52   Total Bilirubin 0.3 - 1.2 mg/dL 0.6       Latest Ref Rng & Units 08/04/2022    2:52 AM  CBC  WBC 4.0 - 10.5 K/uL 6.5   Hemoglobin 13.0 - 17.0 g/dL 78.2   Hematocrit 95.6 - 52.0 % 38.1   Platelets 150 - 400 K/uL 246    Lipid Panel     Component Value Date/Time   CHOL 150 08/04/2022 0252   TRIG 122 08/04/2022 0252   HDL 36 (L) 08/04/2022 0252   CHOLHDL 4.2 08/04/2022 0252   VLDL 24 08/04/2022 0252   LDLCALC 90 08/04/2022 0252   LDLDIRECT 97 08/04/2022 0252   Cardiac Panel (last 3 results) No results for input(s): "CKTOTAL", "CKMB", "TROPONINI", "RELINDX" in the last 72 hours.  HEMOGLOBIN A1C Lab Results  Component Value Date   HGBA1C 5.9 (H) 08/03/2022  MPG 123 08/03/2022   TSH Recent Labs    08/03/22 1601  TSH 2.817   Imaging: Imaging results have been reviewed and Portable chest x-ray 1 view  Result Date: 08/03/2022 CLINICAL DATA:  Chest pain EXAM: PORTABLE CHEST 1 VIEW COMPARISON:  03/08/2018 FINDINGS: The cardiac silhouette, mediastinal and hilar contours are normal. The lungs are clear. No pleural effusions. No pulmonary lesions. No pneumothorax. The bony thorax is intact. IMPRESSION: No acute cardiopulmonary findings. Electronically Signed   By: Rudie Meyer M.D.   On: 08/03/2022 16:21    Cardiac Studies:  EKG: Aug 03, 2022: Normal sinus rhythm, 66 bpm, without underlying ischemia or injury pattern.   Echocardiogram: Pending    Heart Catheterization: Pending   No results found for this or any previous visit (from the past [address] hour(s)).  Scheduled Meds:  amLODipine  2.5 mg Oral Daily   aspirin EC  81 mg Oral Daily   famotidine  20 mg Oral Daily   levothyroxine  150 mcg Oral Daily   loratadine  10 mg Oral Daily   magnesium oxide  400 mg Oral Daily   multivitamin with minerals  1 tablet Oral Daily   pantoprazole  40 mg Oral Daily   simvastatin  20 mg Oral Daily   sodium chloride  flush  3 mL Intravenous Q12H   sodium chloride flush  3 mL Intravenous Q12H   Continuous Infusions:  sodium chloride     sodium chloride     sodium chloride 1 mL/kg/hr (08/04/22 0515)   [START ON 08/05/2022] sodium chloride     Followed by   Melene Muller ON 08/05/2022] sodium chloride     heparin 1,250 Units/hr (08/04/22 0414)   nitroGLYCERIN     PRN Meds:.sodium chloride, sodium chloride, acetaminophen, nitroGLYCERIN, ondansetron (ZOFRAN) IV, sodium chloride flush, sodium chloride flush  Assessment  Brian Elliott is a 57 y.o. male patient with past medical history and cardiovascular risk factors include: HTN, Hypothyroidism, hyperlipidemia, OSA status post UP3 and on CPAP, GERD, erectile dysfunction, former smoker.   Impression: Unstable angina. Benign essential hypertension. Obstructive sleep apnea on CPAP status post UP3. Hyperlipidemia  Plan:   Unstable angina: Symptoms ongoing for the last 3 weeks. Continues to have rest pain. I will place on nitro gtt, continue heparin gtt. Heart cath planned for today around 1pm   Benign essential hypertension: Continue home medications. Monitor for now   Mixed hyperlipidemia: Continue home dose of simvastatin. Fasting lipid profile in the morning. Further recommendations to follow.      Clotilde Dieter, DO 08/04/2022, 8:37 AM Office: [phone] Fax: [phone] Pager: [phone]

## 2022-08-04 NOTE — Plan of Care (Signed)

## 2022-08-04 NOTE — Plan of Care (Signed)
Problem: Education: Goal: Understanding of cardiac disease, CV risk reduction, and recovery process will improve 08/04/2022 1749 by Herma Carson, RN Outcome: Adequate for Discharge 08/04/2022 4782 by Herma Carson, RN Outcome: Progressing Goal: Individualized Educational Video(s) 08/04/2022 1749 by Herma Carson, RN Outcome: Adequate for Discharge 08/04/2022 9562 by Herma Carson, RN Outcome: Progressing   Problem: Activity: Goal: Ability to tolerate increased activity will improve 08/04/2022 1749 by Herma Carson, RN Outcome: Adequate for Discharge 08/04/2022 0807 by Herma Carson, RN Outcome: Progressing   Problem: Cardiac: Goal: Ability to achieve and maintain adequate cardiovascular perfusion will improve 08/04/2022 1749 by Herma Carson, RN Outcome: Adequate for Discharge 08/04/2022 0807 by Herma Carson, RN Outcome: Progressing   Problem: Health Behavior/Discharge Planning: Goal: Ability to safely manage health-related needs after discharge will improve 08/04/2022 1749 by Herma Carson, RN Outcome: Adequate for Discharge 08/04/2022 0807 by Herma Carson, RN Outcome: Progressing   Problem: Education: Goal: Understanding of CV disease, CV risk reduction, and recovery process will improve 08/04/2022 1749 by Herma Carson, RN Outcome: Adequate for Discharge 08/04/2022 1308 by Herma Carson, RN Outcome: Progressing Goal: Individualized Educational Video(s) 08/04/2022 1749 by Herma Carson, RN Outcome: Adequate for Discharge 08/04/2022 0807 by Herma Carson, RN Outcome: Progressing   Problem: Activity: Goal: Ability to return to baseline activity level will improve 08/04/2022 1749 by Herma Carson, RN Outcome: Adequate for Discharge 08/04/2022 0807 by Herma Carson, RN Outcome: Progressing   Problem: Cardiovascular: Goal: Ability to achieve and maintain adequate cardiovascular perfusion will improve 08/04/2022 1749 by Herma Carson,  RN Outcome: Adequate for Discharge 08/04/2022 0807 by Herma Carson, RN Outcome: Progressing Goal: Vascular access site(s) Level 0-1 will be maintained 08/04/2022 1749 by Herma Carson, RN Outcome: Adequate for Discharge 08/04/2022 0807 by Herma Carson, RN Outcome: Progressing   Problem: Health Behavior/Discharge Planning: Goal: Ability to safely manage health-related needs after discharge will improve 08/04/2022 1749 by Herma Carson, RN Outcome: Adequate for Discharge 08/04/2022 0807 by Herma Carson, RN Outcome: Progressing   Problem: Education: Goal: Knowledge of General Education information will improve Description: Including pain rating scale, medication(s)/side effects and non-pharmacologic comfort measures 08/04/2022 1749 by Herma Carson, RN Outcome: Adequate for Discharge 08/04/2022 0807 by Herma Carson, RN Outcome: Progressing   Problem: Health Behavior/Discharge Planning: Goal: Ability to manage health-related needs will improve 08/04/2022 1749 by Herma Carson, RN Outcome: Adequate for Discharge 08/04/2022 0807 by Herma Carson, RN Outcome: Progressing   Problem: Clinical Measurements: Goal: Ability to maintain clinical measurements within normal limits will improve 08/04/2022 1749 by Herma Carson, RN Outcome: Adequate for Discharge 08/04/2022 0807 by Herma Carson, RN Outcome: Progressing Goal: Will remain free from infection 08/04/2022 1749 by Herma Carson, RN Outcome: Adequate for Discharge 08/04/2022 0807 by Herma Carson, RN Outcome: Progressing Goal: Diagnostic test results will improve 08/04/2022 1749 by Herma Carson, RN Outcome: Adequate for Discharge 08/04/2022 0807 by Herma Carson, RN Outcome: Progressing Goal: Respiratory complications will improve 08/04/2022 1749 by Herma Carson, RN Outcome: Adequate for Discharge 08/04/2022 0807 by Herma Carson, RN Outcome: Progressing Goal: Cardiovascular complication will be  avoided 08/04/2022 1749 by Herma Carson, RN Outcome: Adequate for Discharge 08/04/2022 6578 by Herma Carson, RN Outcome: Progressing   Problem: Activity: Goal: Risk for activity intolerance will decrease 08/04/2022 1749 by Herma Carson, RN Outcome: Adequate for Discharge 08/04/2022  1610 by Herma Carson, RN Outcome: Progressing   Problem: Nutrition: Goal: Adequate nutrition will be maintained 08/04/2022 1749 by Herma Carson, RN Outcome: Adequate for Discharge 08/04/2022 0807 by Herma Carson, RN Outcome: Progressing   Problem: Coping: Goal: Level of anxiety will decrease 08/04/2022 1749 by Herma Carson, RN Outcome: Adequate for Discharge 08/04/2022 0807 by Herma Carson, RN Outcome: Progressing   Problem: Elimination: Goal: Will not experience complications related to bowel motility 08/04/2022 1749 by Herma Carson, RN Outcome: Adequate for Discharge 08/04/2022 0807 by Herma Carson, RN Outcome: Progressing Goal: Will not experience complications related to urinary retention 08/04/2022 1749 by Herma Carson, RN Outcome: Adequate for Discharge 08/04/2022 0807 by Herma Carson, RN Outcome: Progressing   Problem: Pain Managment: Goal: General experience of comfort will improve 08/04/2022 1749 by Herma Carson, RN Outcome: Adequate for Discharge 08/04/2022 0807 by Herma Carson, RN Outcome: Progressing   Problem: Safety: Goal: Ability to remain free from injury will improve 08/04/2022 1749 by Herma Carson, RN Outcome: Adequate for Discharge 08/04/2022 0807 by Herma Carson, RN Outcome: Progressing   Problem: Skin Integrity: Goal: Risk for impaired skin integrity will decrease 08/04/2022 1749 by Herma Carson, RN Outcome: Adequate for Discharge 08/04/2022 [id number] by Herma Carson, RN Outcome: Progressing

## 2022-08-04 NOTE — TOC Transition Note (Signed)
Update: TOC discharge meds were picked up by RN.

## 2022-08-04 NOTE — Progress Notes (Signed)
Echocardiogram 2D Echocardiogram has been performed.  Toni Amend 08/04/2022, 9:23 AM

## 2022-08-04 NOTE — TOC Transition Note (Signed)
TOC discharge medications READY and LOCATED in [facility] on 2nd floor awaiting pt discharge.

## 2022-08-05 LAB — LIPOPROTEIN A (LPA): Lipoprotein (a): 47.8 nmol/L — ABNORMAL HIGH (ref <75.0)

## 2022-08-08 ENCOUNTER — Other Ambulatory Visit: Payer: Self-pay

## 2022-08-08 ENCOUNTER — Ambulatory Visit: Payer: No Typology Code available for payment source | Admitting: Cardiology

## 2022-08-08 ENCOUNTER — Encounter (HOSPITAL_COMMUNITY): Payer: Self-pay | Admitting: Cardiology

## 2022-08-08 ENCOUNTER — Emergency Department (HOSPITAL_BASED_OUTPATIENT_CLINIC_OR_DEPARTMENT_OTHER): Payer: 59 | Admitting: Radiology

## 2022-08-08 ENCOUNTER — Emergency Department (HOSPITAL_BASED_OUTPATIENT_CLINIC_OR_DEPARTMENT_OTHER)
Admission: EM | Admit: 2022-08-08 | Discharge: 2022-08-09 | Disposition: A | Discharge status: Home / Self Care | Payer: 59 | Attending: Emergency Medicine | Admitting: Emergency Medicine

## 2022-08-08 ENCOUNTER — Telehealth (HOSPITAL_COMMUNITY): Payer: Self-pay | Admitting: *Deleted

## 2022-08-08 DIAGNOSIS — R079 Chest pain, unspecified: Secondary | ICD-10-CM

## 2022-08-08 DIAGNOSIS — R0789 Other chest pain: Secondary | ICD-10-CM | POA: Insufficient documentation

## 2022-08-08 DIAGNOSIS — T50901A Poisoning by unspecified drugs, medicaments and biological substances, accidental (unintentional), initial encounter: Secondary | ICD-10-CM

## 2022-08-08 LAB — CBC
HCT: 42.9 % (ref 39.0–52.0)
Hemoglobin: 14.8 g/dL (ref 13.0–17.0)
MCH: 30.6 pg (ref 26.0–34.0)
MCHC: 34.5 g/dL (ref 30.0–36.0)
MCV: 88.8 fL (ref 80.0–100.0)
Platelets: 245 10*3/uL (ref 150–400)
RBC: 4.83 MIL/uL (ref 4.22–5.81)
RDW: 13.4 % (ref 11.5–15.5)
WBC: 7.5 10*3/uL (ref 4.0–10.5)
nRBC: 0 % (ref 0.0–0.2)

## 2022-08-08 LAB — BASIC METABOLIC PANEL
Anion gap: 10 (ref 5–15)
BUN: 20 mg/dL (ref 6–20)
CO2: 24 mmol/L (ref 22–32)
Calcium: 9.7 mg/dL (ref 8.9–10.3)
Chloride: 102 mmol/L (ref 98–111)
Creatinine, Ser: 1.1 mg/dL (ref 0.61–1.24)
GFR, Estimated: >60 mL/min (ref >60)
Glucose, Bld: 113 mg/dL — ABNORMAL HIGH (ref 70–99)
Potassium: 4.8 mmol/L (ref 3.5–5.1)
Sodium: 136 mmol/L (ref 135–145)

## 2022-08-08 MED FILL — Heparin Sodium (Porcine) Inj 1000 Unit/ML: INTRAMUSCULAR | Qty: 10 | Status: AC

## 2022-08-08 NOTE — ED Provider Notes (Signed)
EMERGENCY DEPARTMENT AT [facility]  Provider Note  CSN: [id number] Arrival date & time: 08/08/22 2300  History Chief Complaint  Patient presents with   Chest Pain    Brian Elliott is a 57 y.o. male with history of HLD, OSA, prior smoker, seen by Dr. Odis Hollingshead for exertional chest pain on 5/23, admitted to the the hospital that day and had LHC on 5/24 showing a 99% lesion in LAD, s/p DES, discharged that same day on DAPT. Presents today for evaluation of chest tightness began around 2000hrs tonight. No SOB. He reports the discomfort is hard to describe and does not call it a pain. He decided to go take his evening medications and lie down, but he accidentally took all of the morning medications a second time which included his Brillinta as well as his metoprolol, ASA and his synthroid ( ). He reports he was lying in bed a short time later when he began to feel shaking and jittery which prompted his ED visit. That sensation has since passed, he still has some mild chest discomfort.    Home Medications Prior to Admission medications   Medication Sig Start Date End Date Taking? Authorizing Provider  acetaminophen (TYLENOL) 500 MG tablet Take 500 mg by mouth every 6 (six) hours as needed for moderate pain.    [provider]  amLODipine (NORVASC) 2.5 MG tablet Take 2.5 mg by mouth daily. 07/27/22   [provider]  atorvastatin (LIPITOR) 40 MG tablet Take 1 tablet (40 mg total) by mouth daily. 08/04/22 08/04/23  Yates Decamp, MD  [employer] ASPIRIN LOW DOSE 81 MG tablet Take 81 mg by mouth daily. 07/27/22   [provider]  famotidine (PEPCID) 20 MG tablet Take 20 mg by mouth daily. 07/11/22   [provider]  fexofenadine (ALLEGRA) 180 MG tablet Take 180 mg by mouth daily.    [provider]  levothyroxine (SYNTHROID) 150 MCG tablet Take 150 mcg by mouth daily. 09/05/19   [provider]  MAGNESIUM PO Take 200 mg by mouth in the  morning and at bedtime.    [provider]  metoprolol succinate (TOPROL-XL) 25 MG 24 hr tablet Take 1 tablet (25 mg total) by mouth daily. Take with or immediately following a meal. 08/04/22 11/02/22  Yates Decamp, MD  Multiple Vitamin (MULTIVITAMIN) tablet Take 1 tablet by mouth daily.    [provider]  nitroGLYCERIN (NITROSTAT) 0.4 MG SL tablet Place 0.4 mg under the tongue every 5 (five) minutes as needed for chest pain.    [provider]  Omega-3 Fatty Acids (FISH OIL PO) Take 1 capsule by mouth daily.    [provider]  omeprazole (PRILOSEC) 40 MG capsule Take 40 mg by mouth daily.    [provider]  ticagrelor (BRILINTA) 90 MG TABS tablet Take 1 tablet (90 mg total) by mouth 2 (two) times daily. 08/04/22   Yates Decamp, MD  vitamin E 180 MG (400 UNITS) capsule Take 400 Units by mouth daily.    [provider]  zinc gluconate 50 MG tablet Take 50 mg by mouth in the morning and at bedtime.    [provider]     Allergies    Patient has no known allergies.   Review of Systems   Review of Systems Please see HPI for pertinent positives and negatives  Physical Exam BP 128/76   Pulse (!) 58   Temp 98.4 F (36.9 C) (Oral)   Resp  12   SpO2 94%   Physical Exam Vitals and nursing note reviewed.  Constitutional:      Appearance: Normal appearance.  HENT:     Head: Normocephalic and atraumatic.     Nose: Nose normal.     Mouth/Throat:     Mouth: Mucous membranes are moist.  Eyes:     Extraocular Movements: Extraocular movements intact.     Conjunctiva/sclera: Conjunctivae normal.  Cardiovascular:     Rate and Rhythm: Normal rate.  Pulmonary:     Effort: Pulmonary effort is normal.     Breath sounds: Normal breath sounds.  Abdominal:     General: Abdomen is flat.     Palpations: Abdomen is soft.     Tenderness: There is no abdominal tenderness.  Musculoskeletal:        General: No swelling. Normal range of  motion.     Cervical back: Neck supple.  Skin:    General: Skin is warm and dry.  Neurological:     General: No focal deficit present.     Mental Status: He is alert.  Psychiatric:        Mood and Affect: Mood normal.     ED Results / Procedures / Treatments   EKG EKG Interpretation  Date/Time:  Tuesday Aug 08 2022 23:13:34 EDT Ventricular Rate:  77 PR Interval:  158 QRS Duration: 96 QT Interval:  360 QTC Calculation: 407 R Axis:   83 Text Interpretation: Normal sinus rhythm Inferior infarct (cited on or before 04-Aug-2022) Cannot rule out Anterior infarct , age undetermined Abnormal ECG When compared with ECG of 04-Aug-2022 15:15, Vent. rate has increased BY  26 BPM Inverted T waves have replaced nonspecific T wave abnormality in Inferior leads Confirmed by Susy Frizzle [phone]) on 08/08/2022 11:28:00 PM  Procedures Procedures  Medications Ordered in the ED Medications - No data to display  Initial Impression and Plan  Patient here with chest pain after recent LHC and stent. Symptoms are not typical for ACS. His jitteriness may have been due to taking an extra synthroid by accident this evening. He looks comfortable now. Will check labs, CXR. EKG without ST elevation to indicate a clotted stent.   ED Course   Clinical Course as of 08/09/22 0300  Wed Aug 09, 2022  0037 CBC and BMP are normal. Initial Trop is mildly elevated, unclear if this is related to recent cath or indicative of AMI. Will continue to monitor and check a delta.  [CS]  0037 I personally viewed the images from radiology studies and agree with radiologist interpretation: CXR is clear [CS]  0204 Repeat Trop is flat.  [CS]  0257 Patient's symptoms have resolved. He is resting comfortably in no distress. Spoke with Dr. Melton Alar, Cardiologist on call for [facility]. She does not recommend admission and would like for the patient to follow up in the office. Patient and wife are comfortable with this plan.   Advised patient to hold his morning dose of synthroid given he accidentally took an extra tonight, otherwise continues with usual medications as prescribed. RTED, preferably [facility], if symptoms worsen.  [CS]    Clinical Course User Index [CS] Pollyann Savoy, MD     MDM Rules/Calculators/A&P Medical Decision Making Problems Addressed: Accidental overdose, initial encounter: acute illness or injury Chest pain, unspecified type: acute illness or injury that poses a threat to life or bodily functions  Amount and/or Complexity of Data Reviewed Labs: ordered. Decision-making details documented in ED Course. Radiology: ordered and  independent interpretation performed. Decision-making details documented in ED Course. ECG/medicine tests: ordered and independent interpretation performed. Decision-making details documented in ED Course.  Risk Prescription drug management. Decision regarding hospitalization.     Final Clinical Impression(s) / ED Diagnoses Final diagnoses:  Chest pain, unspecified type  Accidental overdose, initial encounter    Rx / DC Orders ED Discharge Orders     None        Pollyann Savoy, MD 08/09/22 0300

## 2022-08-08 NOTE — ED Triage Notes (Signed)
Chest tightness/soreness started around 8pm Had heart cath on Friday. With stent placement No sob, some nausea

## 2022-08-08 NOTE — ED Notes (Signed)
Call x2 for room  

## 2022-08-08 NOTE — Telephone Encounter (Signed)
CARDIAC REHAB PHASE I   Unable to reach via telephone for post stent education. Will mail written educational materials to address on file. Referral to [facility] for CRP2 has been sent.    Woodroe Chen, RN BSN 08/08/2022 1:24 PM

## 2022-08-09 ENCOUNTER — Encounter: Payer: Self-pay | Admitting: Internal Medicine

## 2022-08-09 ENCOUNTER — Ambulatory Visit: Payer: No Typology Code available for payment source | Admitting: Internal Medicine

## 2022-08-09 VITALS — BP 147/89 | HR 77 | Ht 69.0 in | Wt 227.0 lb

## 2022-08-09 DIAGNOSIS — Z09 Encounter for follow-up examination after completed treatment for conditions other than malignant neoplasm: Secondary | ICD-10-CM

## 2022-08-09 DIAGNOSIS — I200 Unstable angina: Secondary | ICD-10-CM

## 2022-08-09 LAB — TROPONIN I (HIGH SENSITIVITY)
Troponin I (High Sensitivity): 37 ng/L — ABNORMAL HIGH (ref <18)
Troponin I (High Sensitivity): 35 ng/L — ABNORMAL HIGH (ref <18)

## 2022-08-09 MED ORDER — OMEPRAZOLE 40 MG PO CPDR: 40.0000 mg | DELAYED_RELEASE_CAPSULE | Freq: Every day | ORAL | 3 refills | Status: DC

## 2022-08-09 MED ORDER — ISOSORBIDE MONONITRATE ER 30 MG PO TB24: 30.0000 mg | ORAL_TABLET | Freq: Every day | ORAL | 3 refills | Status: DC

## 2022-08-09 MED ORDER — SERTRALINE HCL 25 MG PO TABS
25.0000 mg | ORAL_TABLET | Freq: Every day | ORAL | 3 refills | Status: DC
Start: 2022-08-09 — End: 2023-03-15

## 2022-08-09 MED ORDER — LOSARTAN POTASSIUM 25 MG PO TABS: 12.5000 mg | ORAL_TABLET | Freq: Every day | ORAL | 3 refills | Status: DC

## 2022-08-09 NOTE — ED Notes (Signed)
Received report from Megan, RN

## 2022-08-09 NOTE — Progress Notes (Signed)
ID:  Brian Elliott, DOB Mar 07, 1966, MRN [id number]  PCP:  Charlane Ferretti, DO  Cardiologist:  Clotilde Dieter, DO, [facility] (established care 08/03/22)  REASON FOR CONSULT: Chest pain and hyperlipidemia.   REQUESTING PHYSICIAN:  Charlane Ferretti, DO [address] [place],  [place] [address]  Chief Complaint  Patient presents with   Chest Pain   Hospitalization Follow-up    HPI  Brian Elliott is a 56 y.o. Caucasian male who presents to the clinic for evaluation of chest pain and hyperlipidemia at the request of Charlane Ferretti, DO. His past medical history and cardiovascular risk factors include: Hypothyroidism, hyperlipidemia, OSA status post UP3 and on CPAP, GERD, erectile dysfunction, former smoker.   Patient is referred to the practice for evaluation of chest pain and hyperlipidemia.  Patient recently had left heart catheterization with intervention to his LAD. He was discharged home in stable condition. A few days later, he accidentally took his synthroid twice and went to the hospital feeling very anxious and just overall terrible. He was given IV fluids and blood pressure medications and then discharged home. He is now here for follow up from last night's ED visit. He is feeling pretty good but he has a lot of anxiety since he had his heart catheterization. He has never had heart problems before this and it is difficult to get used to it. Otherwise, he is taking his medications as prescribed and would like to start something for anxiety.  ALLERGIES: Not on File  MEDICATION LIST PRIOR TO VISIT: Current Meds  Medication Sig   acetaminophen (TYLENOL) 500 MG tablet Take 500 mg by mouth every 6 (six) hours as needed for moderate pain.   atorvastatin (LIPITOR) 40 MG tablet Take 1 tablet (40 mg total) by mouth daily.   CVS ASPIRIN LOW DOSE 81 MG tablet Take 81 mg by mouth daily.   famotidine (PEPCID) 20 MG tablet Take 20 mg by mouth daily.   fexofenadine (ALLEGRA) 180 MG tablet Take 180  mg by mouth daily.   isosorbide mononitrate (IMDUR) 30 MG 24 hr tablet Take 1 tablet (30 mg total) by mouth at bedtime.   levothyroxine (SYNTHROID) 150 MCG tablet Take 150 mcg by mouth daily.   losartan (COZAAR) 25 MG tablet Take 0.5 tablets (12.5 mg total) by mouth at bedtime.   MAGNESIUM PO Take 200 mg by mouth in the morning and at bedtime.   metoprolol succinate (TOPROL-XL) 25 MG 24 hr tablet Take 1 tablet (25 mg total) by mouth daily. Take with or immediately following a meal.   Multiple Vitamin (MULTIVITAMIN) tablet Take 1 tablet by mouth daily.   nitroGLYCERIN (NITROSTAT) 0.4 MG SL tablet Place 0.4 mg under the tongue every 5 (five) minutes as needed for chest pain.   Omega-3 Fatty Acids (FISH OIL PO) Take 1 capsule by mouth daily.   sertraline (ZOLOFT) 25 MG tablet Take 1 tablet (25 mg total) by mouth daily.   ticagrelor (BRILINTA) 90 MG TABS tablet Take 1 tablet (90 mg total) by mouth 2 (two) times daily.   vitamin E 180 MG (400 UNITS) capsule Take 400 Units by mouth daily.   zinc gluconate 50 MG tablet Take 50 mg by mouth in the morning and at bedtime.   [DISCONTINUED] amLODipine (NORVASC) 2.5 MG tablet Take 2.5 mg by mouth daily.   [DISCONTINUED] omeprazole (PRILOSEC) 40 MG capsule Take 40 mg by mouth daily.     PAST MEDICAL HISTORY: Past Medical History:  Diagnosis Date   Allergy  seasonal   GERD (gastroesophageal reflux disease)    Hyperlipidemia    Hypothyroid    Sleep apnea    wears cpap    PAST SURGICAL HISTORY: Past Surgical History:  Procedure Laterality Date   ADENOIDECTOMY  1994   COLONOSCOPY  10/15/2014   Russella Dar   CORONARY STENT INTERVENTION N/A 08/04/2022   Procedure: CORONARY STENT INTERVENTION;  Surgeon: Yates Decamp, MD;  Location: [facility] INVASIVE CV LAB;  Service: Cardiovascular;  Laterality: N/A;   LEFT HEART CATH AND CORONARY ANGIOGRAPHY N/A 08/04/2022   Procedure: LEFT HEART CATH AND CORONARY ANGIOGRAPHY;  Surgeon: Yates Decamp, MD;  Location: [facility] INVASIVE CV  LAB;  Service: Cardiovascular;  Laterality: N/A;   POLYPECTOMY     UVULOPALATOPHARYNGOPLASTY  1994    FAMILY HISTORY: The patient family history includes Colon polyps in his father; Emphysema in his father; Heart disease in his mother; Liver cancer in his maternal grandmother and paternal grandfather; Pancreatic cancer in his brother; Prostate cancer in his maternal grandfather.  SOCIAL HISTORY:  The patient  reports that he has been smoking cigarettes and cigars. He has a 30.00 pack-year smoking history. He has never used smokeless tobacco. He reports current alcohol use of about 2.0 standard drinks of alcohol per week. He reports that he does not use drugs.  REVIEW OF SYSTEMS: Review of Systems  Cardiovascular:  Negative for chest pain, claudication, dyspnea on exertion, irregular heartbeat, leg swelling, near-syncope, orthopnea, palpitations, paroxysmal nocturnal dyspnea and syncope.  Respiratory:  Negative for shortness of breath.   Hematologic/Lymphatic: Negative for bleeding problem.  Musculoskeletal:  Negative for muscle cramps and myalgias.  Gastrointestinal:  Negative for heartburn.  Neurological:  Negative for dizziness and light-headedness.    PHYSICAL EXAM:    08/09/2022    9:11 AM 08/09/2022    3:00 AM 08/09/2022    2:10 AM  Vitals with BMI  Height 5\' 9"     Weight 227 lbs    BMI 33.51    Systolic 147 126 161  Diastolic 89 87 76  Pulse 77 62 58    Physical Exam  Constitutional: No distress.  Age appropriate, hemodynamically stable.   Neck: No JVD present.  Cardiovascular: Normal rate, regular rhythm, S1 normal, S2 normal, intact distal pulses and normal pulses. Exam reveals no gallop, no S3 and no S4.  No murmur heard. Pulses:      Radial pulses are 2+ on the right side and 2+ on the left side.       Dorsalis pedis pulses are 2+ on the right side and 2+ on the left side.       Posterior tibial pulses are 2+ on the right side and 2+ on the left side.   Pulmonary/Chest: Effort normal and breath sounds normal. No stridor. He has no wheezes. He has no rales.  Abdominal: Soft. Bowel sounds are normal. He exhibits no distension. There is no abdominal tenderness.  Musculoskeletal:        General: No edema.     Cervical back: Neck supple.  Neurological: He is alert and oriented to person, place, and time. He has intact cranial nerves (2-12).  Skin: Skin is warm and moist.   CARDIAC DATABASE: EKG: Aug 03, 2022: Normal sinus rhythm, 66 bpm, without underlying ischemia or injury pattern.  Echocardiogram 08/04/2022:  1. Left ventricular ejection fraction, by estimation, is 55 to 60%. The  left ventricle has normal function. The left ventricle has no regional  wall motion abnormalities. Left ventricular diastolic parameters were  normal.   2. Right ventricular systolic function is normal. The right ventricular  size is upper limit of normal. There is normal pulmonary artery systolic  pressure. The estimated right ventricular systolic pressure is 29.5 mmHg.   3. The mitral valve is normal in structure. No evidence of mitral valve  regurgitation. No evidence of mitral stenosis.   4. The aortic valve is tricuspid. Aortic valve regurgitation is not  visualized. No aortic stenosis is present.   5. The inferior vena cava is dilated in size with >50% respiratory  variability, suggesting right atrial pressure of 8 mmHg.   6. Rhythm strip during this exam demonstrates sinus bradycardia.     Stress Testing: No results found for this or any previous visit from the past 1095 days.   Heart Catheterization: Left Heart Catheterization 08/04/22:  LV: 129/6, EDP 16 mmHg.  Ao 121/62, mean 90 mmHg.  No pressure gradient across the aortic valve. LVEF 55% without wall motion abnormality. LM: Large-caliber vessel.  Smooth and normal. LAD: Large-caliber vessel.  Gives origin to small-sized D1 and a moderate to large size D2.  The proximal LAD from the origin  of the septal perforator 1 all the way to the mid segment has a high-grade 99% stenosis followed by a tandem 80% stenosis of the origin of D2.  TIMI II flow.  Diagonals are widely patent and normal. Cx: Moderate caliber vessel, smooth and normal. RI: Very large caliber vessel.  Smooth and normal. RCA: Dominant.  Smooth and normal.      Intervention data: Successful PTCA and stenting of the proximal and mid LAD with implantation of  a 3.0 x 38 mm Synergy XD DES postdilated with a 3.5 mm [place] balloon at 16 atmospheric pressure.  Stenosis reduced from 99% to 0% with TIMI II to TIMI-3 flow at the end of the procedure.   Recommendation: Patient will need DAPT for 1 year in view of ACS.  Risk factor modification indicated.  LABORATORY DATA:  External Labs: Collected: October 13, 2021 Cholesterol 201, HDL 45, LDL 117, non-HDL 156, triglycerides 195  Collected: Jul 27, 2022 provided by the patient. Hemoglobin 14.4, hematocrit 43.8% eGFR 62.2. Sodium 138, potassium 4.3, chloride 104, bicarb 25. AST 28, ALT 232, alkaline phosphatase 58  IMPRESSION:    ICD-10-CM   1. Unstable angina (HCC)  I20.0 EKG 12-Lead    2. Hospital discharge follow-up  Z09        RECOMMENDATIONS: Brian Elliott is a 57 y.o. Caucasian male whose past medical history and cardiac risk factors include: HTN, Hypothyroidism, hyperlipidemia, OSA status post UP3 and on CPAP, GERD, erectile dysfunction, former smoker.   Unstable angina (HCC) S/P left heart cath with intervention to LAD Continue on GDMT I have added losartan and Imdur to his current regiment   Hospital discharge follow-up Patient accidentally took double his synthroid He is presently stable just very anxious I will add low dose Zoloft to his med regiment He is also asking for a refill on his omeprazole   FINAL MEDICATION LIST END OF ENCOUNTER: Meds ordered this encounter  Medications   isosorbide mononitrate (IMDUR) 30 MG 24 hr tablet    Sig:  Take 1 tablet (30 mg total) by mouth at bedtime.    Dispense:  90 tablet    Refill:  3   omeprazole (PRILOSEC) 40 MG capsule    Sig: Take 1 capsule (40 mg total) by mouth daily.    Dispense:  90 capsule    Refill:  3   sertraline (ZOLOFT) 25 MG tablet    Sig: Take 1 tablet (25 mg total) by mouth daily.    Dispense:  90 tablet    Refill:  3   losartan (COZAAR) 25 MG tablet    Sig: Take 0.5 tablets (12.5 mg total) by mouth at bedtime.    Dispense:  90 tablet    Refill:  3    Medications Discontinued During This Encounter  Medication Reason   amLODipine (NORVASC) 2.5 MG tablet    omeprazole (PRILOSEC) 40 MG capsule Reorder     Current Outpatient Medications:    acetaminophen (TYLENOL) 500 MG tablet, Take 500 mg by mouth every 6 (six) hours as needed for moderate pain., Disp: , Rfl:    atorvastatin (LIPITOR) 40 MG tablet, Take 1 tablet (40 mg total) by mouth daily., Disp: 30 tablet, Rfl: 3   CVS ASPIRIN LOW DOSE 81 MG tablet, Take 81 mg by mouth daily., Disp: , Rfl:    famotidine (PEPCID) 20 MG tablet, Take 20 mg by mouth daily., Disp: , Rfl:    fexofenadine (ALLEGRA) 180 MG tablet, Take 180 mg by mouth daily., Disp: , Rfl:    isosorbide mononitrate (IMDUR) 30 MG 24 hr tablet, Take 1 tablet (30 mg total) by mouth at bedtime., Disp: 90 tablet, Rfl: 3   levothyroxine (SYNTHROID) 150 MCG tablet, Take 150 mcg by mouth daily., Disp: , Rfl:    losartan (COZAAR) 25 MG tablet, Take 0.5 tablets (12.5 mg total) by mouth at bedtime., Disp: 90 tablet, Rfl: 3   MAGNESIUM PO, Take 200 mg by mouth in the morning and at bedtime., Disp: , Rfl:    metoprolol succinate (TOPROL-XL) 25 MG 24 hr tablet, Take 1 tablet (25 mg total) by mouth daily. Take with or immediately following a meal., Disp: 30 tablet, Rfl: 2   Multiple Vitamin (MULTIVITAMIN) tablet, Take 1 tablet by mouth daily., Disp: , Rfl:    nitroGLYCERIN (NITROSTAT) 0.4 MG SL tablet, Place 0.4 mg under the tongue every 5 (five) minutes as needed  for chest pain., Disp: , Rfl:    Omega-3 Fatty Acids (FISH OIL PO), Take 1 capsule by mouth daily., Disp: , Rfl:    sertraline (ZOLOFT) 25 MG tablet, Take 1 tablet (25 mg total) by mouth daily., Disp: 90 tablet, Rfl: 3   ticagrelor (BRILINTA) 90 MG TABS tablet, Take 1 tablet (90 mg total) by mouth 2 (two) times daily., Disp: 60 tablet, Rfl: 0   vitamin E 180 MG (400 UNITS) capsule, Take 400 Units by mouth daily., Disp: , Rfl:    zinc gluconate 50 MG tablet, Take 50 mg by mouth in the morning and at bedtime., Disp: , Rfl:    omeprazole (PRILOSEC) 40 MG capsule, Take 1 capsule (40 mg total) by mouth daily., Disp: 90 capsule, Rfl: 3  Orders Placed This Encounter  Procedures   EKG 12-Lead      Clotilde Dieter, DO  Pager:  [phone] Office: [phone]

## 2022-08-22 ENCOUNTER — Ambulatory Visit: Payer: No Typology Code available for payment source | Admitting: Cardiology

## 2022-08-22 ENCOUNTER — Encounter: Payer: Self-pay | Admitting: Cardiology

## 2022-08-22 VITALS — BP 146/89 | HR 59 | Ht 69.0 in | Wt 231.0 lb

## 2022-08-22 DIAGNOSIS — Z955 Presence of coronary angioplasty implant and graft: Secondary | ICD-10-CM

## 2022-08-22 DIAGNOSIS — E782 Mixed hyperlipidemia: Secondary | ICD-10-CM

## 2022-08-22 DIAGNOSIS — I10 Essential (primary) hypertension: Secondary | ICD-10-CM

## 2022-08-22 DIAGNOSIS — I2510 Atherosclerotic heart disease of native coronary artery without angina pectoris: Secondary | ICD-10-CM

## 2022-08-22 MED ORDER — EZETIMIBE 10 MG PO TABS
10.0000 mg | ORAL_TABLET | Freq: Every day | ORAL | 3 refills | Status: DC
Start: 2022-08-22 — End: 2023-07-18

## 2022-08-22 NOTE — Progress Notes (Signed)
ID:  Brian Elliott, DOB 1965/11/22, MRN [id number]  PCP:  Charlane Ferretti, DO  Cardiologist:  Tessa Lerner, DO, [facility] (established care 08/03/22)  Date: 08/22/22 Last Office Visit: 08/09/2022  Chief Complaint  Patient presents with   Follow-up    S/p unstable angina and PCI to LAD    HPI  Brian Elliott is a 57 y.o. Caucasian male whose past medical history and cardiovascular risk factors include: CAD status post angioplasty and stenting to the proximal/mid LAD, Hypothyroidism, hyperlipidemia, OSA status post UP3 and on CPAP, GERD, erectile dysfunction, former smoker.   In May 2024 patient presented to the office for evaluation of chest pain and hyperlipidemia.  Based on his symptoms was very concerned for unstable angina.  He was electively admitted to the hospital for further evaluation.  Subsequently underwent angiography and was noted to have obstructive disease in the LAD he underwent percutaneous intervention and later discharged home.  He presents today for follow-up.Since his coronary intervention patient states that he feels significantly better.  He did not know what normal felt like.  But intermittently still has minimal chest discomfort.  His blood pressures at home are better controlled.  He is currently walking 1 mile per day.  Still awaiting to hear back from cardiac rehab.  ALLERGIES: No Known Allergies  MEDICATION LIST PRIOR TO VISIT: Current Meds  Medication Sig   acetaminophen (TYLENOL) 500 MG tablet Take 500 mg by mouth every 6 (six) hours as needed for moderate pain.   atorvastatin (LIPITOR) 40 MG tablet Take 1 tablet (40 mg total) by mouth daily.   [employer] ASPIRIN LOW DOSE 81 MG tablet Take 81 mg by mouth daily.   ezetimibe (ZETIA) 10 MG tablet Take 1 tablet (10 mg total) by mouth daily.   famotidine (PEPCID) 20 MG tablet Take 20 mg by mouth daily.   fexofenadine (ALLEGRA) 180 MG tablet Take 180 mg by mouth daily.   isosorbide mononitrate (IMDUR) 30 MG 24 hr  tablet Take 1 tablet (30 mg total) by mouth at bedtime.   levothyroxine (SYNTHROID) 150 MCG tablet Take 150 mcg by mouth daily.   losartan (COZAAR) 25 MG tablet Take 0.5 tablets (12.5 mg total) by mouth at bedtime.   MAGNESIUM PO Take 200 mg by mouth in the morning and at bedtime.   metoprolol succinate (TOPROL-XL) 25 MG 24 hr tablet Take 1 tablet (25 mg total) by mouth daily. Take with or immediately following a meal.   Multiple Vitamin (MULTIVITAMIN) tablet Take 1 tablet by mouth daily.   nitroGLYCERIN (NITROSTAT) 0.4 MG SL tablet Place 0.4 mg under the tongue every 5 (five) minutes as needed for chest pain.   Omega-3 Fatty Acids (FISH OIL PO) Take 1 capsule by mouth daily.   Omega-3 Fatty Acids (FISH OIL) 1200 MG CAPS Take by mouth.   omeprazole (PRILOSEC) 40 MG capsule Take 1 capsule (40 mg total) by mouth daily.   sertraline (ZOLOFT) 25 MG tablet Take 1 tablet (25 mg total) by mouth daily.   ticagrelor (BRILINTA) 90 MG TABS tablet Take 1 tablet (90 mg total) by mouth 2 (two) times daily.   vitamin E 180 MG (400 UNITS) capsule Take 400 Units by mouth daily.   zinc gluconate 50 MG tablet Take 50 mg by mouth in the morning and at bedtime.     PAST MEDICAL HISTORY: Past Medical History:  Diagnosis Date   Allergy    seasonal   Coronary artery disease    GERD (gastroesophageal  reflux disease)    Hyperlipidemia    Hypothyroid    Sleep apnea    wears cpap    PAST SURGICAL HISTORY: Past Surgical History:  Procedure Laterality Date   ADENOIDECTOMY  1994   CARDIAC CATHETERIZATION     COLONOSCOPY  10/15/2014   Russella Dar   CORONARY STENT INTERVENTION N/A 08/04/2022   Procedure: CORONARY STENT INTERVENTION;  Surgeon: Yates Decamp, MD;  Location: [facility] INVASIVE CV LAB;  Service: Cardiovascular;  Laterality: N/A;   LEFT HEART CATH AND CORONARY ANGIOGRAPHY N/A 08/04/2022   Procedure: LEFT HEART CATH AND CORONARY ANGIOGRAPHY;  Surgeon: Yates Decamp, MD;  Location: [facility] INVASIVE CV LAB;  Service:  Cardiovascular;  Laterality: N/A;   POLYPECTOMY     UVULOPALATOPHARYNGOPLASTY  1994    FAMILY HISTORY: The patient family history includes Colon polyps in his father; Emphysema in his father; Heart disease in his mother; Liver cancer in his maternal grandmother and paternal grandfather; Pancreatic cancer in his brother; Prostate cancer in his maternal grandfather.  SOCIAL HISTORY:  The patient  reports that he has been smoking cigarettes and cigars. He has a 30.00 pack-year smoking history. He has never used smokeless tobacco. He reports current alcohol use of about 2.0 standard drinks of alcohol per week. He reports that he does not use drugs.  REVIEW OF SYSTEMS: Review of Systems  Cardiovascular:  Negative for chest pain, claudication, dyspnea on exertion, irregular heartbeat, leg swelling, near-syncope, orthopnea, palpitations, paroxysmal nocturnal dyspnea and syncope.  Respiratory:  Negative for shortness of breath.   Hematologic/Lymphatic: Negative for bleeding problem.  Musculoskeletal:  Negative for muscle cramps and myalgias.  Gastrointestinal:  Positive for heartburn.  Neurological:  Negative for dizziness and light-headedness.    PHYSICAL EXAM:    08/22/2022    1:53 PM 08/22/2022    1:44 PM 08/09/2022    9:11 AM  Vitals with BMI  Height  5\' 9"  5\' 9"   Weight  231 lbs 227 lbs  BMI  34.1 33.51  Systolic 146 149 161  Diastolic 89 93 89  Pulse 59 65 77    Physical Exam  Constitutional: No distress.  Age appropriate, hemodynamically stable.   Neck: No JVD present.  Cardiovascular: Normal rate, regular rhythm, S1 normal, S2 normal, intact distal pulses and normal pulses. Exam reveals no gallop, no S3 and no S4.  No murmur heard. Pulses:      Radial pulses are 2+ on the right side and 2+ on the left side.       Dorsalis pedis pulses are 2+ on the right side and 2+ on the left side.       Posterior tibial pulses are 2+ on the right side and 2+ on the left side.   Pulmonary/Chest: Effort normal and breath sounds normal. No stridor. He has no wheezes. He has no rales.  Abdominal: Soft. Bowel sounds are normal. He exhibits no distension. There is no abdominal tenderness.  Musculoskeletal:        General: No edema.     Cervical back: Neck supple.  Neurological: He is alert and oriented to person, place, and time. He has intact cranial nerves (2-12).  Skin: Skin is warm and moist.   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  Echocardiogram: Aug 04, 2022: LVEF 55 to 60%, normal diastolic function, RVSP 29.5 mmHg, estimated RAP 8 mmHg, no significant valvular heart disease.   Stress Testing: No results found for this or any  previous visit from the past 1095 days.   Left Heart Catheterization 08/04/22:  LV: 129/6, EDP 16 mmHg.  Ao 121/62, mean 90 mmHg.  No pressure gradient across the aortic valve. LVEF 55% without wall motion abnormality. LM: Large-caliber vessel.  Smooth and normal. LAD: Large-caliber vessel.  Gives origin to small-sized D1 and a moderate to large size D2.  The proximal LAD from the origin of the septal perforator 1 all the way to the mid segment has a high-grade 99% stenosis followed by a tandem 80% stenosis of the origin of D2.  TIMI II flow.  Diagonals are widely patent and normal. Cx: Moderate caliber vessel, smooth and normal. RI: Very large caliber vessel.  Smooth and normal. RCA: Dominant.  Smooth and normal.      Intervention data: Successful PTCA and stenting of the proximal and mid LAD with implantation of  3.0 x 38 mm Synergy XD DES postdilated with a 3.5 mm [place] balloon at 16 atmospheric pressure.  Stenosis reduced from 99% to 0% with TIMI II to TIMI-3 flow at the end of the procedure.   Recommendation: Patient will need DAPT for 1 year in view of ACS.  Risk factor modification indicated.  LABORATORY DATA:  External Labs: Collected: October 13, 2021 Cholesterol 201, HDL 45, LDL 117, non-HDL 156, triglycerides 195  Collected: Jul 27, 2022 provided by the patient. Hemoglobin 14.4, hematocrit 43.8% eGFR 62.2. Sodium 138, potassium 4.3, chloride 104, bicarb 25. AST 28, ALT 232, alkaline phosphatase 58  Lab Results  Component Value Date   CHOL 150 08/04/2022   HDL 36 (L) 08/04/2022   LDLCALC 90 08/04/2022   LDLDIRECT 97 08/04/2022   TRIG 122 08/04/2022   CHOLHDL 4.2 08/04/2022     IMPRESSION:    ICD-10-CM   1. Atherosclerosis of native coronary artery of native heart without angina pectoris  I25.10 EKG 12-Lead    Amb Referral to Cardiac Rehabilitation    ezetimibe (ZETIA) 10 MG tablet    Lipid Panel With LDL/HDL Ratio    LDL cholesterol, direct    CMP14+EGFR    2. H/O heart artery stent  Z95.5 Amb Referral to Cardiac Rehabilitation    ezetimibe (ZETIA) 10 MG tablet    Lipid Panel With LDL/HDL Ratio    LDL cholesterol, direct    CMP14+EGFR    3. Benign hypertension  I10     4. Mixed hyperlipidemia  E78.2        RECOMMENDATIONS: PETE SCHNITZER is a 57 y.o. Caucasian male whose past medical history and cardiac risk factors include: CAD status post angioplasty and stenting to the proximal/mid LAD, Hypothyroidism, hyperlipidemia, OSA status post UP3 and on CPAP, GERD, erectile dysfunction, former smoker.   Atherosclerosis of native coronary artery of native heart without angina pectoris Presented as unstable angina May 2024 H/O heart artery stent Chest pain has improved significantly, some residual discomfort. No use of sublingual nitroglycerin tablet. Antianginal therapies include metoprolol, Imdur LDL currently not at goal. Continue atorvastatin 40 mg p.o. nightly. Start Zetia 10 mg p.o. daily. Labs in 6 weeks to evaluate lipids Echocardiogram, angiography images/report independently reviewed at today's office visit as part of medical decision making. Re emphasized the importance of secondary  prevention. Sending a prescription for cardiac rehab  Benign hypertension Office blood pressures are not at goal. Home blood pressures are better controlled, per patient Recommend a goal SBP over 130 mmHg. Reemphasized importance of low-salt diet.  Mixed hyperlipidemia Currently on atorvastatin.   Start Zetia  as noted above He denies myalgia or other side effects. Lipids rechecked in 6 weeks.  FINAL MEDICATION LIST END OF ENCOUNTER: Meds ordered this encounter  Medications   ezetimibe (ZETIA) 10 MG tablet    Sig: Take 1 tablet (10 mg total) by mouth daily.    Dispense:  90 tablet    Refill:  3    There are no discontinued medications.   Current Outpatient Medications:    acetaminophen (TYLENOL) 500 MG tablet, Take 500 mg by mouth every 6 (six) hours as needed for moderate pain., Disp: , Rfl:    atorvastatin (LIPITOR) 40 MG tablet, Take 1 tablet (40 mg total) by mouth daily., Disp: 30 tablet, Rfl: 3   CVS ASPIRIN LOW DOSE 81 MG tablet, Take 81 mg by mouth daily., Disp: , Rfl:    ezetimibe (ZETIA) 10 MG tablet, Take 1 tablet (10 mg total) by mouth daily., Disp: 90 tablet, Rfl: 3   famotidine (PEPCID) 20 MG tablet, Take 20 mg by mouth daily., Disp: , Rfl:    fexofenadine (ALLEGRA) 180 MG tablet, Take 180 mg by mouth daily., Disp: , Rfl:    isosorbide mononitrate (IMDUR) 30 MG 24 hr tablet, Take 1 tablet (30 mg total) by mouth at bedtime., Disp: 90 tablet, Rfl: 3   levothyroxine (SYNTHROID) 150 MCG tablet, Take 150 mcg by mouth daily., Disp: , Rfl:    losartan (COZAAR) 25 MG tablet, Take 0.5 tablets (12.5 mg total) by mouth at bedtime., Disp: 90 tablet, Rfl: 3   MAGNESIUM PO, Take 200 mg by mouth in the morning and at bedtime., Disp: , Rfl:    metoprolol succinate (TOPROL-XL) 25 MG 24 hr tablet, Take 1 tablet (25 mg total) by mouth daily. Take with or immediately following a meal., Disp: 30 tablet, Rfl: 2   Multiple Vitamin (MULTIVITAMIN) tablet, Take 1 tablet by mouth daily., Disp:  , Rfl:    nitroGLYCERIN (NITROSTAT) 0.4 MG SL tablet, Place 0.4 mg under the tongue every 5 (five) minutes as needed for chest pain., Disp: , Rfl:    Omega-3 Fatty Acids (FISH OIL PO), Take 1 capsule by mouth daily., Disp: , Rfl:    Omega-3 Fatty Acids (FISH OIL) 1200 MG CAPS, Take by mouth., Disp: , Rfl:    omeprazole (PRILOSEC) 40 MG capsule, Take 1 capsule (40 mg total) by mouth daily., Disp: 90 capsule, Rfl: 3   sertraline (ZOLOFT) 25 MG tablet, Take 1 tablet (25 mg total) by mouth daily., Disp: 90 tablet, Rfl: 3   ticagrelor (BRILINTA) 90 MG TABS tablet, Take 1 tablet (90 mg total) by mouth 2 (two) times daily., Disp: 60 tablet, Rfl: 0   vitamin E 180 MG (400 UNITS) capsule, Take 400 Units by mouth daily., Disp: , Rfl:    zinc gluconate 50 MG tablet, Take 50 mg by mouth in the morning and at bedtime., Disp: , Rfl:   Orders Placed This Encounter  Procedures   Lipid Panel With LDL/HDL Ratio   LDL cholesterol, direct   [id number]+[id number]   Amb Referral to Cardiac Rehabilitation   EKG 12-Lead    There are no Patient Instructions on file for this visit.   --Continue cardiac medications as reconciled in final medication list. --Return in about 7 weeks (around 10/10/2022) for Follow up, CAD. or sooner if needed. --Continue follow-up with your primary care physician regarding the management of your other chronic comorbid conditions.  Patient's questions and concerns were addressed to his satisfaction. He voices understanding of the instructions  provided during this encounter.   This note was created using a voice recognition software as a result there may be grammatical errors inadvertently enclosed that do not reflect the nature of this encounter. Every attempt is made to correct such errors.  Tessa Lerner, [place], [facility]  Pager:  [phone] Office: [phone]

## 2022-08-25 ENCOUNTER — Telehealth (HOSPITAL_COMMUNITY): Payer: Self-pay

## 2022-08-25 ENCOUNTER — Other Ambulatory Visit: Payer: Self-pay

## 2022-08-25 ENCOUNTER — Encounter (HOSPITAL_COMMUNITY): Payer: Self-pay

## 2022-08-25 MED ORDER — ATORVASTATIN CALCIUM 40 MG PO TABS: 40.0000 mg | ORAL_TABLET | Freq: Every day | ORAL | 3 refills | Status: DC

## 2022-08-25 MED ORDER — METOPROLOL SUCCINATE ER 25 MG PO TB24: 25.0000 mg | ORAL_TABLET | Freq: Every day | ORAL | 3 refills | Status: DC

## 2022-08-25 MED ORDER — TICAGRELOR 90 MG PO TABS: 90.0000 mg | ORAL_TABLET | Freq: Two times a day (BID) | ORAL | 3 refills | Status: DC

## 2022-08-25 NOTE — Telephone Encounter (Signed)
Attempted to call patient in regards to Cardiac Rehab - LM on VM Mailed letter 

## 2022-09-04 ENCOUNTER — Telehealth (HOSPITAL_COMMUNITY): Payer: Self-pay

## 2022-09-04 ENCOUNTER — Encounter (HOSPITAL_COMMUNITY): Payer: Self-pay

## 2022-09-04 NOTE — Telephone Encounter (Signed)
LVM to call cardiac rehab at [phone]. 

## 2022-09-04 NOTE — Telephone Encounter (Signed)
Called patient to see if he was interested in participating in the Cardiac Rehab Program. Patient stated yes. Patient will come in for orientation on 6/25@8am  and will attend the 6:45 exercise class.   Sent package.

## 2022-09-04 NOTE — Telephone Encounter (Signed)
Reviewed with patient the Cardiac Rehab Cardiac Risk [place] Nursing Assessment. Patient knows office location, and to wear comfortable clothing/closed-toed shoes. Recommended to patient to eat, take medications, and if diabetic, to check blood sugar before coming to classes. Explained orientation may take 2 hours.  

## 2022-09-04 NOTE — Telephone Encounter (Signed)
Pt insurance is active and benefits verified through [facility] Co-pay 0, DED 0/0 met, out of pocket $3,200/$3,166.94 met, co-insurance 10%. no pre-authorization required, Marge/[employer] 09/04/22@9 :30, REF# [id number]   How many CR sessions are covered? (for ICR)no limit Is this a lifetime maximum or an annual maximum? annual Has the member used any of these services to date? no Is there a time limit (weeks/months) on start of program and/or program completion? no

## 2022-09-05 ENCOUNTER — Encounter (HOSPITAL_COMMUNITY)
Admission: RE | Admit: 2022-09-05 | Discharge: 2022-09-05 | Disposition: A | Discharge status: Home / Self Care | Payer: 59 | Source: Ambulatory Visit | Attending: Cardiology | Admitting: Cardiology

## 2022-09-05 ENCOUNTER — Encounter (HOSPITAL_COMMUNITY): Payer: Self-pay

## 2022-09-05 VITALS — BP 108/70 | HR 81 | Ht 70.0 in | Wt 227.1 lb

## 2022-09-05 DIAGNOSIS — Z955 Presence of coronary angioplasty implant and graft: Secondary | ICD-10-CM | POA: Diagnosis present

## 2022-09-05 NOTE — Progress Notes (Signed)
Cardiac Individual Treatment Plan  Patient Details  Name: Brian Elliott MRN: [id number] Date of Birth: 05-10-1965 Referring Provider:   Flowsheet Row INTENSIVE CARDIAC REHAB ORIENT from 09/05/2022 in [facility]  Referring Provider Tessa Lerner, DO       Initial Encounter Date:  Flowsheet Row INTENSIVE CARDIAC REHAB ORIENT from 09/05/2022 in [facility]  Date 09/05/22       Visit Diagnosis: 08/04/22 DES mLAD, pLAD  Patient's Home Medications on Admission:  Current Outpatient Medications:    acetaminophen (TYLENOL) 500 MG tablet, Take 500 mg by mouth every 6 (six) hours as needed for moderate pain., Disp: , Rfl:    atorvastatin (LIPITOR) 40 MG tablet, Take 1 tablet (40 mg total) by mouth daily., Disp: 30 tablet, Rfl: 3   [employer] ASPIRIN LOW DOSE 81 MG tablet, Take 81 mg by mouth daily., Disp: , Rfl:    ezetimibe (ZETIA) 10 MG tablet, Take 1 tablet (10 mg total) by mouth daily., Disp: 90 tablet, Rfl: 3   famotidine (PEPCID) 20 MG tablet, Take 20 mg by mouth daily., Disp: , Rfl:    fexofenadine (ALLEGRA) 180 MG tablet, Take 180 mg by mouth daily., Disp: , Rfl:    isosorbide mononitrate (IMDUR) 30 MG 24 hr tablet, Take 1 tablet (30 mg total) by mouth at bedtime., Disp: 90 tablet, Rfl: 3   levothyroxine (SYNTHROID) 150 MCG tablet, Take 150 mcg by mouth daily., Disp: , Rfl:    losartan (COZAAR) 25 MG tablet, Take 0.5 tablets (12.5 mg total) by mouth at bedtime., Disp: 90 tablet, Rfl: 3   MAGNESIUM PO, Take 200 mg by mouth in the morning and at bedtime., Disp: , Rfl:    metoprolol succinate (TOPROL-XL) 25 MG 24 hr tablet, Take 1 tablet (25 mg total) by mouth daily. Take with or immediately following a meal., Disp: 30 tablet, Rfl: 3   Multiple Vitamin (MULTIVITAMIN) tablet, Take 1 tablet by mouth daily., Disp: , Rfl:    nitroGLYCERIN (NITROSTAT) 0.4 MG SL tablet, Place 0.4 mg  under the tongue every 5 (five) minutes as needed for chest pain., Disp: , Rfl:    Omega-3 Fatty Acids (FISH OIL PO), Take 1 capsule by mouth daily., Disp: , Rfl:    Omega-3 Fatty Acids (FISH OIL) 1200 MG CAPS, Take by mouth., Disp: , Rfl:    omeprazole (PRILOSEC) 40 MG capsule, Take 1 capsule (40 mg total) by mouth daily., Disp: 90 capsule, Rfl: 3   sertraline (ZOLOFT) 25 MG tablet, Take 1 tablet (25 mg total) by mouth daily., Disp: 90 tablet, Rfl: 3   ticagrelor (BRILINTA) 90 MG TABS tablet, Take 1 tablet (90 mg total) by mouth 2 (two) times daily., Disp: 60 tablet, Rfl: 3   vitamin E 180 MG (400 UNITS) capsule, Take 400 Units by mouth daily., Disp: , Rfl:    zinc gluconate 50 MG tablet, Take 50 mg by mouth in the morning and at bedtime., Disp: , Rfl:   Past Medical History: Past Medical History:  Diagnosis Date   Allergy    seasonal   Coronary artery disease    GERD (gastroesophageal reflux disease)    Hyperlipidemia    Hypothyroid    Sleep apnea    wears cpap    Tobacco Use: Social History   Tobacco Use  Smoking Status Former   Packs/day: 2.00   Years: 15.00   Additional pack years:  0.00   Total pack years: 30.00   Types: Cigarettes, Cigars   Quit date: 03/13/1993   Years since quitting: 29.5  Smokeless Tobacco Never  Tobacco Comments   occ Cigars    Labs: Review Flowsheet       Latest Ref Rng & Units 08/03/2022 08/04/2022  Labs for ITP Cardiac and Pulmonary Rehab  Cholestrol 0 - 200 mg/dL - 130   LDL (calc) 0 - 99 mg/dL - 90   Direct LDL 0 - 99 mg/dL - 97   HDL-C >86 mg/dL - 36   Trlycerides <578 mg/dL - 469   Hemoglobin G2X 4.8 - 5.6 % 5.9  -    Capillary Blood Glucose: No results found for: "GLUCAP"   Exercise Target Goals: Exercise Program Goal: Individual exercise prescription set using results from initial 6 min walk test and THRR while considering  patient's activity barriers and safety.   Exercise Prescription Goal: Initial exercise prescription  builds to 30-45 minutes a day of aerobic activity, 2-3 days per week.  Home exercise guidelines will be given to patient during program as part of exercise prescription that the participant will acknowledge.  Activity Barriers & Risk Stratification:  Activity Barriers & Cardiac Risk Stratification - 09/05/22 0907       Activity Barriers & Cardiac Risk Stratification   Activity Barriers Other (comment)    Comments Left knee bothers him occassionally.    Cardiac Risk Stratification Low             6 Minute Walk:  6 Minute Walk     Row Name 09/05/22 0917         6 Minute Walk   Phase Initial     Distance 1872 feet     Walk Time 6 minutes     # of Rest Breaks 0     MPH 3.54     METS 4.12     RPE 9     Perceived Dyspnea  0     VO2 Peak 14.43     Symptoms No     Resting HR 81 bpm     Resting BP 108/70     Resting Oxygen Saturation  97 %     Exercise Oxygen Saturation  during 6 min walk 98 %     Max Ex. HR 100 bpm     Max Ex. BP 110/64     2 Minute Post BP 108/72              Oxygen Initial Assessment:   Oxygen Re-Evaluation:   Oxygen Discharge (Final Oxygen Re-Evaluation):   Initial Exercise Prescription:  Initial Exercise Prescription - 09/05/22 1000       Date of Initial Exercise RX and Referring Provider   Date 09/05/22    Referring Provider Tessa Lerner, DO    Expected Discharge Date 11/17/22      Bike   Level 2    Watts 50    METs 3.8      NuStep   Level 3    SPM 85    Minutes 15    METs 3.2      Prescription Details   Frequency (times per week) 3    Duration Progress to 30 minutes of continuous aerobic without signs/symptoms of physical distress      Intensity   THRR 40-80% of Max Heartrate 65-130    Ratings of Perceived Exertion 11-13    Perceived Dyspnea 0-4      Progression  Progression Continue to progress workloads to maintain intensity without signs/symptoms of physical distress.      Resistance Training   Training  Prescription Yes    Weight 5 lbs    Reps 10-15             Perform Capillary Blood Glucose checks as needed.  Exercise Prescription Changes:   Exercise Comments:   Exercise Goals and Review:   Exercise Goals     Row Name 09/05/22 0855             Exercise Goals   Increase Physical Activity Yes       Intervention Provide advice, education, support and counseling about physical activity/exercise needs.;Develop an individualized exercise prescription for aerobic and resistive training based on initial evaluation findings, risk stratification, comorbidities and participant's personal goals.       Expected Outcomes Short Term: Attend rehab on a regular basis to increase amount of physical activity.;Long Term: Exercising regularly at least 3-5 days a week.;Long Term: Add in home exercise to make exercise part of routine and to increase amount of physical activity.       Increase Strength and Stamina Yes       Intervention Provide advice, education, support and counseling about physical activity/exercise needs.;Develop an individualized exercise prescription for aerobic and resistive training based on initial evaluation findings, risk stratification, comorbidities and participant's personal goals.       Expected Outcomes Short Term: Increase workloads from initial exercise prescription for resistance, speed, and METs.;Short Term: Perform resistance training exercises routinely during rehab and add in resistance training at home;Long Term: Improve cardiorespiratory fitness, muscular endurance and strength as measured by increased METs and functional capacity ( )       Able to understand and use rate of perceived exertion (RPE) scale Yes       Intervention Provide education and explanation on how to use RPE scale       Expected Outcomes Short Term: Able to use RPE daily in rehab to express subjective intensity level;Long Term:  Able to use RPE to guide intensity level when exercising  independently       Knowledge and understanding of Target Heart Rate Range (THRR) Yes       Intervention Provide education and explanation of THRR including how the numbers were predicted and where they are located for reference       Expected Outcomes Short Term: Able to state/look up THRR;Short Term: Able to use daily as guideline for intensity in rehab;Long Term: Able to use THRR to govern intensity when exercising independently       Able to check pulse independently Yes       Intervention Provide education and demonstration on how to check pulse in carotid and radial arteries.;Review the importance of being able to check your own pulse for safety during independent exercise       Expected Outcomes Short Term: Able to explain why pulse checking is important during independent exercise;Long Term: Able to check pulse independently and accurately       Understanding of Exercise Prescription Yes       Intervention Provide education, explanation, and written materials on patient's individual exercise prescription       Expected Outcomes Short Term: Able to explain program exercise prescription;Long Term: Able to explain home exercise prescription to exercise independently                Exercise Goals Re-Evaluation :   Discharge Exercise Prescription (Final Exercise Prescription  Changes):   Nutrition:  Target Goals: Understanding of nutrition guidelines, daily intake of sodium 1500mg , cholesterol 200mg , calories 30% from fat and 7% or less from saturated fats, daily to have 5 or more servings of fruits and vegetables.  Biometrics:  Pre Biometrics - 09/05/22 0800       Pre Biometrics   Waist Circumference 45.75 inches    Hip Circumference 42 inches    Waist to Hip Ratio 1.09 %    Triceps Skinfold 15 mm    % Body Fat 31.5 %    Grip Strength 58 kg    Flexibility 0 in    Single Leg Stand 28.68 seconds              Nutrition Therapy Plan and Nutrition Goals:   Nutrition  Assessments:  MEDIFICTS Score Key: ?70 Need to make dietary changes  40-70 Heart Healthy Diet ? 40 Therapeutic Level Cholesterol Diet    Picture Your Plate Scores: <09 Unhealthy dietary pattern with much room for improvement. 41-50 Dietary pattern unlikely to meet recommendations for good health and room for improvement. 51-60 More healthful dietary pattern, with some room for improvement.  >60 Healthy dietary pattern, although there may be some specific behaviors that could be improved.    Nutrition Goals Re-Evaluation:   Nutrition Goals Re-Evaluation:   Nutrition Goals Discharge (Final Nutrition Goals Re-Evaluation):   Psychosocial: Target Goals: Acknowledge presence or absence of significant depression and/or stress, maximize coping skills, provide positive support system. Participant is able to verbalize types and ability to use techniques and skills needed for reducing stress and depression.  Initial Review & Psychosocial Screening:  Initial Psych Review & Screening - 09/05/22 0927       Initial Review   Current issues with None Identified      Family Dynamics   Good Support System? Yes    Comments Pt stated he has a good support system, and he has a positive attitude toward cardiac rehab.      Barriers   Psychosocial barriers to participate in program There are no identifiable barriers or psychosocial needs.      Screening Interventions   Interventions Encouraged to exercise;Provide feedback about the scores to participant    Expected Outcomes Long Term Goal: Stressors or current issues are controlled or eliminated.;Long Term goal: The participant improves quality of Life and PHQ9 Scores as seen by post scores and/or verbalization of changes             Quality of Life Scores:  Quality of Life - 09/05/22 1004       Quality of Life   Select Quality of Life      Quality of Life Scores   Health/Function Pre 28.67 %    Socioeconomic Pre 30 %     Psych/Spiritual Pre 29.14 %    Family Pre 30 %    GLOBAL Pre 29.24 %            Scores of 19 and below usually indicate a poorer quality of life in these areas.  A difference of  2-3 points is a clinically meaningful difference.  A difference of 2-3 points in the total score of the Quality of Life Index has been associated with significant improvement in overall quality of life, self-image, physical symptoms, and general health in studies assessing change in quality of life.  PHQ-9: Review Flowsheet       09/05/2022  Depression screen PHQ 2/9  Decreased Interest 0  Down, Depressed,  Hopeless 0  PHQ - 2 Score 0  Altered sleeping 0  Tired, decreased energy 0  Change in appetite 0  Feeling bad or failure about yourself  0  Trouble concentrating 0  Moving slowly or fidgety/restless 0  Suicidal thoughts 0  PHQ-9 Score 0   Interpretation of Total Score  Total Score Depression Severity:  1-4 = Minimal depression, 5-9 = Mild depression, 10-14 = Moderate depression, 15-19 = Moderately severe depression, 20-27 = Severe depression   Psychosocial Evaluation and Intervention:  Psychosocial Evaluation - 09/05/22 0949       Psychosocial Evaluation & Interventions   Interventions Encouraged to exercise with the program and follow exercise prescription    Continue Psychosocial Services  No Follow up required             Psychosocial Re-Evaluation:   Psychosocial Discharge (Final Psychosocial Re-Evaluation):   Vocational Rehabilitation: Provide vocational rehab assistance to qualifying candidates.   Vocational Rehab Evaluation & Intervention:  Vocational Rehab - 09/05/22 0953       Initial Vocational Rehab Evaluation & Intervention   Assessment shows need for Vocational Rehabilitation No             Education: Education Goals: Education classes will be provided on a weekly basis, covering required topics. Participant will state understanding/return demonstration of  topics presented.     Core Videos: Exercise    Move It!  Clinical staff conducted group or individual video education with verbal and written material and guidebook.  Patient learns the recommended Pritikin exercise program. Exercise with the goal of living a long, healthy life. Some of the health benefits of exercise include controlled diabetes, healthier blood pressure levels, improved cholesterol levels, improved heart and lung capacity, improved sleep, and better body composition. Everyone should speak with their doctor before starting or changing an exercise routine.  Biomechanical Limitations Clinical staff conducted group or individual video education with verbal and written material and guidebook.  Patient learns how biomechanical limitations can impact exercise and how we can mitigate and possibly overcome limitations to have an impactful and balanced exercise routine.  Body Composition Clinical staff conducted group or individual video education with verbal and written material and guidebook.  Patient learns that body composition (ratio of muscle mass to fat mass) is a key component to assessing overall fitness, rather than body weight alone. Increased fat mass, especially visceral belly fat, can put [place] at increased risk for metabolic syndrome, type 2 diabetes, heart disease, and even death. It is recommended to combine diet and exercise (cardiovascular and resistance training) to improve your body composition. Seek guidance from your physician and exercise physiologist before implementing an exercise routine.  Exercise Action Plan Clinical staff conducted group or individual video education with verbal and written material and guidebook.  Patient learns the recommended strategies to achieve and enjoy long-term exercise adherence, including variety, self-motivation, self-efficacy, and positive decision making. Benefits of exercise include fitness, good health, weight management, more  energy, better sleep, less stress, and overall well-being.  Medical   Heart Disease Risk Reduction Clinical staff conducted group or individual video education with verbal and written material and guidebook.  Patient learns our heart is our most vital organ as it circulates oxygen, nutrients, white blood cells, and hormones throughout the entire body, and carries waste away. Data supports a plant-based eating plan like the Pritikin Program for its effectiveness in slowing progression of and reversing heart disease. The video provides a number of recommendations to address heart  disease.   Metabolic Syndrome and Belly Fat  Clinical staff conducted group or individual video education with verbal and written material and guidebook.  Patient learns what metabolic syndrome is, how it leads to heart disease, and how one can reverse it and keep it from coming back. You have metabolic syndrome if you have 3 of the following 5 criteria: abdominal obesity, high blood pressure, high triglycerides, low HDL cholesterol, and high blood sugar.  Hypertension and Heart Disease Clinical staff conducted group or individual video education with verbal and written material and guidebook.  Patient learns that high blood pressure, or hypertension, is very common in the [place]. Hypertension is largely due to excessive salt intake, but other important risk factors include being overweight, physical inactivity, drinking too much alcohol, smoking, and not eating enough potassium from fruits and vegetables. High blood pressure is a leading risk factor for heart attack, stroke, congestive heart failure, dementia, kidney failure, and premature death. Long-term effects of excessive salt intake include stiffening of the arteries and thickening of heart muscle and organ damage. Recommendations include ways to reduce hypertension and the risk of heart disease.  Diseases of Our Time - Focusing on Diabetes Clinical staff  conducted group or individual video education with verbal and written material and guidebook.  Patient learns why the best way to stop diseases of our time is prevention, through food and other lifestyle changes. Medicine (such as prescription pills and surgeries) is often only a Band-Aid on the problem, not a long-term solution. Most common diseases of our time include obesity, type 2 diabetes, hypertension, heart disease, and cancer. The Pritikin Program is recommended and has been proven to help reduce, reverse, and/or prevent the damaging effects of metabolic syndrome.  Nutrition   Overview of the Pritikin Eating Plan  Clinical staff conducted group or individual video education with verbal and written material and guidebook.  Patient learns about the Pritikin Eating Plan for disease risk reduction. The Pritikin Eating Plan emphasizes a wide variety of unrefined, minimally-processed carbohydrates, like fruits, vegetables, whole grains, and legumes. Go, Caution, and Stop food choices are explained. Plant-based and lean animal proteins are emphasized. Rationale provided for low sodium intake for blood pressure control, low added sugars for blood sugar stabilization, and low added fats and oils for coronary artery disease risk reduction and weight management.  Calorie Density  Clinical staff conducted group or individual video education with verbal and written material and guidebook.  Patient learns about calorie density and how it impacts the Pritikin Eating Plan. Knowing the characteristics of the food you choose will help you decide whether those foods will lead to weight gain or weight loss, and whether you want to consume more or less of them. Weight loss is usually a side effect of the Pritikin Eating Plan because of its focus on low calorie-dense foods.  Label Reading  Clinical staff conducted group or individual video education with verbal and written material and guidebook.  Patient learns  about the Pritikin recommended label reading guidelines and corresponding recommendations regarding calorie density, added sugars, sodium content, and whole grains.  Dining Out - Part 1  Clinical staff conducted group or individual video education with verbal and written material and guidebook.  Patient learns that restaurant meals can be sabotaging because they can be so high in calories, fat, sodium, and/or sugar. Patient learns recommended strategies on how to positively address this and avoid unhealthy pitfalls.  Facts on Fats  Clinical staff conducted group or individual  video education with verbal and written material and guidebook.  Patient learns that lifestyle modifications can be just as effective, if not more so, as many medications for lowering your risk of heart disease. A Pritikin lifestyle can help to reduce your risk of inflammation and atherosclerosis (cholesterol build-up, or plaque, in the artery walls). Lifestyle interventions such as dietary choices and physical activity address the cause of atherosclerosis. A review of the types of fats and their impact on blood cholesterol levels, along with dietary recommendations to reduce fat intake is also included.  Nutrition Action Plan  Clinical staff conducted group or individual video education with verbal and written material and guidebook.  Patient learns how to incorporate Pritikin recommendations into their lifestyle. Recommendations include planning and keeping personal health goals in mind as an important part of their success.  Healthy Mind-Set    Healthy Minds, Bodies, Hearts  Clinical staff conducted group or individual video education with verbal and written material and guidebook.  Patient learns how to identify when they are stressed. Video will discuss the impact of that stress, as well as the many benefits of stress management. Patient will also be introduced to stress management techniques. The way we think, act, and  feel has an impact on our hearts.  How Our Thoughts Can Heal Our Hearts  Clinical staff conducted group or individual video education with verbal and written material and guidebook.  Patient learns that negative thoughts can cause depression and anxiety. This can result in negative lifestyle behavior and serious health problems. Cognitive behavioral therapy is an effective method to help control our thoughts in order to change and improve our emotional outlook.  Additional Videos:  Exercise    Improving Performance  Clinical staff conducted group or individual video education with verbal and written material and guidebook.  Patient learns to use a non-linear approach by alternating intensity levels and lengths of time spent exercising to help burn more calories and lose more body fat. Cardiovascular exercise helps improve heart health, metabolism, hormonal balance, blood sugar control, and recovery from fatigue. Resistance training improves strength, endurance, balance, coordination, reaction time, metabolism, and muscle mass. Flexibility exercise improves circulation, posture, and balance. Seek guidance from your physician and exercise physiologist before implementing an exercise routine and learn your capabilities and proper form for all exercise.  Introduction to Yoga  Clinical staff conducted group or individual video education with verbal and written material and guidebook.  Patient learns about yoga, a discipline of the coming together of mind, breath, and body. The benefits of yoga include improved flexibility, improved range of motion, better posture and core strength, increased lung function, weight loss, and positive self-image. Yoga's heart health benefits include lowered blood pressure, healthier heart rate, decreased cholesterol and triglyceride levels, improved immune function, and reduced stress. Seek guidance from your physician and exercise physiologist before implementing an exercise  routine and learn your capabilities and proper form for all exercise.  Medical   Aging: Enhancing Your Quality of Life  Clinical staff conducted group or individual video education with verbal and written material and guidebook.  Patient learns key strategies and recommendations to stay in good physical health and enhance quality of life, such as prevention strategies, having an advocate, securing a Health Care Proxy and Power of Attorney, and keeping a list of medications and system for tracking them. It also discusses how to avoid risk for bone loss.  Biology of Weight Control  Clinical staff conducted group or individual video education with  verbal and written material and guidebook.  Patient learns that weight gain occurs because we consume more calories than we burn (eating more, moving less). Even if your body weight is normal, you may have higher ratios of fat compared to muscle mass. Too much body fat puts you at increased risk for cardiovascular disease, heart attack, stroke, type 2 diabetes, and obesity-related cancers. In addition to exercise, following the Pritikin Eating Plan can help reduce your risk.  Decoding Lab Results  Clinical staff conducted group or individual video education with verbal and written material and guidebook.  Patient learns that lab test reflects one measurement whose values change over time and are influenced by many factors, including medication, stress, sleep, exercise, food, hydration, pre-existing medical conditions, and more. It is recommended to use the knowledge from this video to become more involved with your lab results and evaluate your numbers to speak with your doctor.   Diseases of Our Time - Overview  Clinical staff conducted group or individual video education with verbal and written material and guidebook.  Patient learns that according to the CDC, 50% to 70% of chronic diseases (such as obesity, type 2 diabetes, elevated lipids, hypertension,  and heart disease) are avoidable through lifestyle improvements including healthier food choices, listening to satiety cues, and increased physical activity.  Sleep Disorders Clinical staff conducted group or individual video education with verbal and written material and guidebook.  Patient learns how good quality and duration of sleep are important to overall health and well-being. Patient also learns about sleep disorders and how they impact health along with recommendations to address them, including discussing with a physician.  Nutrition  Dining Out - Part 2 Clinical staff conducted group or individual video education with verbal and written material and guidebook.  Patient learns how to plan ahead and communicate in order to maximize their dining experience in a healthy and nutritious manner. Included are recommended food choices based on the type of restaurant the patient is visiting.   Fueling a Banker conducted group or individual video education with verbal and written material and guidebook.  There is a strong connection between our food choices and our health. Diseases like obesity and type 2 diabetes are very prevalent and are in large-part due to lifestyle choices. The Pritikin Eating Plan provides plenty of food and hunger-curbing satisfaction. It is easy to follow, affordable, and helps reduce health risks.  Menu Workshop  Clinical staff conducted group or individual video education with verbal and written material and guidebook.  Patient learns that restaurant meals can sabotage health goals because they are often packed with calories, fat, sodium, and sugar. Recommendations include strategies to plan ahead and to communicate with the manager, chef, or server to help order a healthier meal.  Planning Your Eating Strategy  Clinical staff conducted group or individual video education with verbal and written material and guidebook.  Patient learns about the  Pritikin Eating Plan and its benefit of reducing the risk of disease. The Pritikin Eating Plan does not focus on calories. Instead, it emphasizes high-quality, nutrient-rich foods. By knowing the characteristics of the foods, we choose, we can determine their calorie density and make informed decisions.  Targeting Your Nutrition Priorities  Clinical staff conducted group or individual video education with verbal and written material and guidebook.  Patient learns that lifestyle habits have a tremendous impact on disease risk and progression. This video provides eating and physical activity recommendations based on your personal health  goals, such as reducing LDL cholesterol, losing weight, preventing or controlling type 2 diabetes, and reducing high blood pressure.  Vitamins and Minerals  Clinical staff conducted group or individual video education with verbal and written material and guidebook.  Patient learns different ways to obtain key vitamins and minerals, including through a recommended healthy diet. It is important to discuss all supplements you take with your doctor.   Healthy Mind-Set    Smoking Cessation  Clinical staff conducted group or individual video education with verbal and written material and guidebook.  Patient learns that cigarette smoking and tobacco addiction pose a serious health risk which affects millions of people. Stopping smoking will significantly reduce the risk of heart disease, lung disease, and many forms of cancer. Recommended strategies for quitting are covered, including working with your doctor to develop a successful plan.  Culinary   Becoming a Set designer conducted group or individual video education with verbal and written material and guidebook.  Patient learns that cooking at home can be healthy, cost-effective, quick, and puts them in control. Keys to cooking healthy recipes will include looking at your recipe, assessing your  equipment needs, planning ahead, making it simple, choosing cost-effective seasonal ingredients, and limiting the use of added fats, salts, and sugars.  Cooking - Breakfast and Snacks  Clinical staff conducted group or individual video education with verbal and written material and guidebook.  Patient learns how important breakfast is to satiety and nutrition through the entire day. Recommendations include key foods to eat during breakfast to help stabilize blood sugar levels and to prevent overeating at meals later in the day. Planning ahead is also a key component.  Cooking - Educational psychologist conducted group or individual video education with verbal and written material and guidebook.  Patient learns eating strategies to improve overall health, including an approach to cook more at home. Recommendations include thinking of animal protein as a side on your plate rather than center stage and focusing instead on lower calorie dense options like vegetables, fruits, whole grains, and plant-based proteins, such as beans. Making sauces in large quantities to freeze for later and leaving the skin on your vegetables are also recommended to maximize your experience.  Cooking - Healthy Salads and Dressing Clinical staff conducted group or individual video education with verbal and written material and guidebook.  Patient learns that vegetables, fruits, whole grains, and legumes are the foundations of the Pritikin Eating Plan. Recommendations include how to incorporate each of these in flavorful and healthy salads, and how to create homemade salad dressings. Proper handling of ingredients is also covered. Cooking - Soups and [employer] - Soups and Desserts Clinical staff conducted group or individual video education with verbal and written material and guidebook.  Patient learns that Pritikin soups and desserts make for easy, nutritious, and delicious snacks and meal components that are  low in sodium, fat, sugar, and calorie density, while high in vitamins, minerals, and filling fiber. Recommendations include simple and healthy ideas for soups and desserts.   Overview     The Pritikin Solution Program Overview Clinical staff conducted group or individual video education with verbal and written material and guidebook.  Patient learns that the results of the Pritikin Program have been documented in more than 100 articles published in peer-reviewed journals, and the benefits include reducing risk factors for (and, in some cases, even reversing) high cholesterol, high blood pressure, type 2 diabetes, obesity, and more!  An overview of the three key pillars of the Pritikin Program will be covered: eating well, doing regular exercise, and having a healthy mind-set.  WORKSHOPS  Exercise: Exercise Basics: Building Your Action Plan Clinical staff led group instruction and group discussion with PowerPoint presentation and patient guidebook. To enhance the learning environment the use of posters, models and videos may be added. At the conclusion of this workshop, patients will comprehend the difference between physical activity and exercise, as well as the benefits of incorporating both, into their routine. Patients will understand the FITT (Frequency, Intensity, Time, and Type) principle and how to use it to build an exercise action plan. In addition, safety concerns and other considerations for exercise and cardiac rehab will be addressed by the presenter. The purpose of this lesson is to promote a comprehensive and effective weekly exercise routine in order to improve patients' overall level of fitness.   Managing Heart Disease: Your Path to a Healthier Heart Clinical staff led group instruction and group discussion with PowerPoint presentation and patient guidebook. To enhance the learning environment the use of posters, models and videos may be added.At the conclusion of this workshop,  patients will understand the anatomy and physiology of the heart. Additionally, they will understand how Pritikin's three pillars impact the risk factors, the progression, and the management of heart disease.  The purpose of this lesson is to provide a high-level overview of the heart, heart disease, and how the Pritikin lifestyle positively impacts risk factors.  Exercise Biomechanics Clinical staff led group instruction and group discussion with PowerPoint presentation and patient guidebook. To enhance the learning environment the use of posters, models and videos may be added. Patients will learn how the structural parts of their bodies function and how these functions impact their daily activities, movement, and exercise. Patients will learn how to promote a neutral spine, learn how to manage pain, and identify ways to improve their physical movement in order to promote healthy living. The purpose of this lesson is to expose patients to common physical limitations that impact physical activity. Participants will learn practical ways to adapt and manage aches and pains, and to minimize their effect on regular exercise. Patients will learn how to maintain good posture while sitting, walking, and lifting.  Balance Training and Fall Prevention  Clinical staff led group instruction and group discussion with PowerPoint presentation and patient guidebook. To enhance the learning environment the use of posters, models and videos may be added. At the conclusion of this workshop, patients will understand the importance of their sensorimotor skills (vision, proprioception, and the vestibular system) in maintaining their ability to balance as they age. Patients will apply a variety of balancing exercises that are appropriate for their current level of function. Patients will understand the common causes for poor balance, possible solutions to these problems, and ways to modify their physical environment  in order to minimize their fall risk. The purpose of this lesson is to teach patients about the importance of maintaining balance as they age and ways to minimize their risk of falling.  WORKSHOPS   Nutrition:  Fueling a Ship broker led group instruction and group discussion with PowerPoint presentation and patient guidebook. To enhance the learning environment the use of posters, models and videos may be added. Patients will review the foundational principles of the Pritikin Eating Plan and understand what constitutes a serving size in each of the food groups. Patients will also learn Pritikin-friendly foods that are better choices when  away from home and review make-ahead meal and snack options. Calorie density will be reviewed and applied to three nutrition priorities: weight maintenance, weight loss, and weight gain. The purpose of this lesson is to reinforce (in a group setting) the key concepts around what patients are recommended to eat and how to apply these guidelines when away from home by planning and selecting Pritikin-friendly options. Patients will understand how calorie density may be adjusted for different weight management goals.  Mindful Eating  Clinical staff led group instruction and group discussion with PowerPoint presentation and patient guidebook. To enhance the learning environment the use of posters, models and videos may be added. Patients will briefly review the concepts of the Pritikin Eating Plan and the importance of low-calorie dense foods. The concept of mindful eating will be introduced as well as the importance of paying attention to internal hunger signals. Triggers for non-hunger eating and techniques for dealing with triggers will be explored. The purpose of this lesson is to provide patients with the opportunity to review the basic principles of the Pritikin Eating Plan, discuss the value of eating mindfully and how to measure internal cues of hunger  and fullness using the Hunger Scale. Patients will also discuss reasons for non-hunger eating and learn strategies to use for controlling emotional eating.  Targeting Your Nutrition Priorities Clinical staff led group instruction and group discussion with PowerPoint presentation and patient guidebook. To enhance the learning environment the use of posters, models and videos may be added. Patients will learn how to determine their genetic susceptibility to disease by reviewing their family history. Patients will gain insight into the importance of diet as part of an overall healthy lifestyle in mitigating the impact of genetics and other environmental insults. The purpose of this lesson is to provide patients with the opportunity to assess their personal nutrition priorities by looking at their family history, their own health history and current risk factors. Patients will also be able to discuss ways of prioritizing and modifying the Pritikin Eating Plan for their highest risk areas  Menu  Clinical staff led group instruction and group discussion with PowerPoint presentation and patient guidebook. To enhance the learning environment the use of posters, models and videos may be added. Using menus brought in from [employer], or printed from [employer], patients will apply the Pritikin dining out guidelines that were presented in the [employer] video. Patients will also be able to practice these guidelines in a variety of provided scenarios. The purpose of this lesson is to provide patients with the opportunity to practice hands-on learning of the Pritikin Dining Out guidelines with actual menus and practice scenarios.  Label Reading Clinical staff led group instruction and group discussion with PowerPoint presentation and patient guidebook. To enhance the learning environment the use of posters, models and videos may be added. Patients will review and discuss the Pritikin label  reading guidelines presented in Pritikin's Label Reading Educational series video. Using fool labels brought in from local grocery stores and markets, patients will apply the label reading guidelines and determine if the packaged food meet the Pritikin guidelines. The purpose of this lesson is to provide patients with the opportunity to review, discuss, and practice hands-on learning of the Pritikin Label Reading guidelines with actual packaged food labels. Cooking School  Pritikin's [employer] are designed to teach patients ways to prepare quick, simple, and affordable recipes at home. The importance of nutrition's role in chronic disease risk reduction is  reflected in its emphasis in the overall Pritikin program. By learning how to prepare essential core Pritikin Eating Plan recipes, patients will increase control over what they eat; be able to customize the flavor of foods without the use of added salt, sugar, or fat; and improve the quality of the food they consume. By learning a set of core recipes which are easily assembled, quickly prepared, and affordable, patients are more likely to prepare more healthy foods at home. These workshops focus on convenient breakfasts, simple entres, side dishes, and desserts which can be prepared with minimal effort and are consistent with nutrition recommendations for cardiovascular risk reduction. Cooking [employer] are taught by a Armed forces logistics/support/administrative officer (RD) who has been trained by the [employer]. The chef or RD has a clear understanding of the importance of minimizing - if not completely eliminating - added fat, sugar, and sodium in recipes. Throughout the series of Cooking School Workshop sessions, patients will learn about healthy ingredients and efficient methods of cooking to build confidence in their capability to prepare    Cooking School weekly topics:  Adding Flavor- Sodium-Free  Fast and Healthy  Breakfasts  Powerhouse Plant-Based Proteins  Satisfying Salads and Dressings  Simple Sides and Sauces  International Cuisine-Spotlight on the [employer] Zones  Delicious Desserts  Savory Soups  [employer] - Meals in a Astronomer Appetizers and Snacks  Comforting Weekend Breakfasts  One-Pot Wonders   Fast Evening Meals  Landscape architect Your Pritikin Plate  WORKSHOPS   Healthy Mindset (Psychosocial):  Focused Goals, Sustainable Changes Clinical staff led group instruction and group discussion with PowerPoint presentation and patient guidebook. To enhance the learning environment the use of posters, models and videos may be added. Patients will be able to apply effective goal setting strategies to establish at least one personal goal, and then take consistent, meaningful action toward that goal. They will learn to identify common barriers to achieving personal goals and develop strategies to overcome them. Patients will also gain an understanding of how our mind-set can impact our ability to achieve goals and the importance of cultivating a positive and growth-oriented mind-set. The purpose of this lesson is to provide patients with a deeper understanding of how to set and achieve personal goals, as well as the tools and strategies needed to overcome common obstacles which may arise along the way.  From Head to Heart: The Power of a Healthy Outlook  Clinical staff led group instruction and group discussion with PowerPoint presentation and patient guidebook. To enhance the learning environment the use of posters, models and videos may be added. Patients will be able to recognize and describe the impact of emotions and mood on physical health. They will discover the importance of self-care and explore self-care practices which may work for them. Patients will also learn how to utilize the 4 C's to cultivate a healthier outlook and better manage stress and challenges. The  purpose of this lesson is to demonstrate to patients how a healthy outlook is an essential part of maintaining good health, especially as they continue their cardiac rehab journey.  Healthy Sleep for a Healthy Heart Clinical staff led group instruction and group discussion with PowerPoint presentation and patient guidebook. To enhance the learning environment the use of posters, models and videos may be added. At the conclusion of this workshop, patients will be able to demonstrate knowledge of the importance of sleep to overall health, well-being, and quality of life. They  will understand the symptoms of, and treatments for, common sleep disorders. Patients will also be able to identify daytime and nighttime behaviors which impact sleep, and they will be able to apply these tools to help manage sleep-related challenges. The purpose of this lesson is to provide patients with a general overview of sleep and outline the importance of quality sleep. Patients will learn about a few of the most common sleep disorders. Patients will also be introduced to the concept of "sleep hygiene," and discover ways to self-manage certain sleeping problems through simple daily behavior changes. Finally, the workshop will motivate patients by clarifying the links between quality sleep and their goals of heart-healthy living.   Recognizing and Reducing Stress Clinical staff led group instruction and group discussion with PowerPoint presentation and patient guidebook. To enhance the learning environment the use of posters, models and videos may be added. At the conclusion of this workshop, patients will be able to understand the types of stress reactions, differentiate between acute and chronic stress, and recognize the impact that chronic stress has on their health. They will also be able to apply different coping mechanisms, such as reframing negative self-talk. Patients will have the opportunity to practice a variety of stress  management techniques, such as deep abdominal breathing, progressive muscle relaxation, and/or guided imagery.  The purpose of this lesson is to educate patients on the role of stress in their lives and to provide healthy techniques for coping with it.  Learning Barriers/Preferences:  Learning Barriers/Preferences - 09/05/22 0951       Learning Barriers/Preferences   Learning Barriers None    Learning Preferences Video;Skilled Demonstration             Education Topics:  Knowledge Questionnaire Score:  Knowledge Questionnaire Score - 09/05/22 1004       Knowledge Questionnaire Score   Pre Score 18/24             Core Components/Risk Factors/Patient Goals at Admission:  Personal Goals and Risk Factors at Admission - 09/05/22 1011       Core Components/Risk Factors/Patient Goals on Admission    Weight Management Yes;Obesity;Weight Loss    Intervention Weight Management/Obesity: Establish reasonable short term and long term weight goals.;Obesity: Provide education and appropriate resources to help participant work on and attain dietary goals.    Admit Weight 227 lb 1.2 oz (103 kg)    Expected Outcomes Short Term: Continue to assess and modify interventions until short term weight is achieved;Long Term: Adherence to nutrition and physical activity/exercise program aimed toward attainment of established weight goal;Weight Loss: Understanding of general recommendations for a balanced deficit meal plan, which promotes 1-2 lb weight loss per week and includes a negative energy balance of [phone] kcal/d    Hypertension Yes    Intervention Provide education on lifestyle modifcations including regular physical activity/exercise, weight management, moderate sodium restriction and increased consumption of fresh fruit, vegetables, and low fat dairy, alcohol moderation, and smoking cessation.;Monitor prescription use compliance.    Expected Outcomes Short Term: Continued assessment and  intervention until BP is < 140/4mm HG in hypertensive participants. < 130/2mm HG in hypertensive participants with diabetes, heart failure or chronic kidney disease.;Long Term: Maintenance of blood pressure at goal levels.    Lipids Yes    Intervention Provide education and support for participant on nutrition & aerobic/resistive exercise along with prescribed medications to achieve LDL 70mg , HDL >40mg .    Expected Outcomes Short Term: Participant states understanding of desired cholesterol values and  is compliant with medications prescribed. Participant is following exercise prescription and nutrition guidelines.;Long Term: Cholesterol controlled with medications as prescribed, with individualized exercise RX and with personalized nutrition plan. Value goals: LDL < 70mg , HDL > 40 mg.             Core Components/Risk Factors/Patient Goals Review:    Core Components/Risk Factors/Patient Goals at Discharge (Final Review):    ITP Comments:  ITP Comments     Row Name 09/05/22 0800           ITP Comments Medical Director- Dr. Armanda Magic, MD. Introduction to the Pritikin Education Program / Intensive Cardiac Rehab. Reviewed orientation folder with patient.                Comments: Mr. Trudo attended orientation for the intensive cardiac rehabilitation program on  09/05/2022  to perform initial intake and exercise walk test. He was introduced to the [employer], and the orientation packet was reviewed. Completed 6-minute walk test, measurements, initial ITP, and exercise prescription. Vital signs stable. Telemetry-normal sinus rhythm, asymptomatic.   Service time was from 800 to 933.  Artist Pais, MS, ACSM CEP

## 2022-09-05 NOTE — Progress Notes (Signed)
Cardiac Rehab Medication Review by a Nurse   Does the patient  feel that his/her medications are working for him/her?  yes   Has the patient been experiencing any side effects to the medications prescribed?  no   Does the patient measure his/her own blood pressure or blood glucose at home?  yes   Does the patient have any problems obtaining medications due to transportation or finances?  no   Understanding of regimen: excellent Understanding of indications: excellent Potential of compliance: excellent     Nurse comments: patient stated he is knowledgeable about medications and will keep a BP diary.

## 2022-09-05 NOTE — Progress Notes (Signed)
Pt stated his preferred learning preferences are doing and visual.

## 2022-09-11 ENCOUNTER — Encounter (HOSPITAL_COMMUNITY)
Admission: RE | Admit: 2022-09-11 | Discharge: 2022-09-11 | Disposition: A | Discharge status: Home / Self Care | Payer: 59 | Source: Ambulatory Visit | Attending: Cardiology | Admitting: Cardiology

## 2022-09-11 DIAGNOSIS — Z955 Presence of coronary angioplasty implant and graft: Secondary | ICD-10-CM | POA: Insufficient documentation

## 2022-09-11 NOTE — Progress Notes (Addendum)
Daily Session Note  Patient Details  Name: Brian Elliott MRN: [id number] Date of Birth: 02-07-1966 Referring Provider:   Flowsheet Row INTENSIVE CARDIAC REHAB ORIENT from 09/05/2022 in [facility]  Referring Provider Tessa Lerner, DO       Encounter Date: 09/11/2022  Check In:  Session Check In - 09/11/22 8657       Check-In   Supervising physician immediately available to respond to emergencies [facility] MD immediately available    Physician(s) Robin Searing, NP    Location [facility]    Staff Present Velora Mediate, RN, MSN;Kaylee Earlene Plater, MS, ACSM-CEP, Exercise Physiologist;Annedrea Remus Loffler, RN, MHA;Jetta Walker BS, ACSM-CEP, Exercise Physiologist;Johnny Hale Bogus, MS, Exercise Physiologist    Virtual Visit No    Medication changes reported     No    Fall or balance concerns reported    No    Tobacco Cessation No Change    Warm-up and Cool-down Performed as group-led instruction    Resistance Training Performed Yes    VAD Patient? No    PAD/SET Patient? No      Pain Assessment   Currently in Pain? No/denies    Pain Score 0-No pain    Multiple Pain Sites No             Capillary Blood Glucose: No results found for this or any previous visit (from the past 24 hour(s)).   Exercise Prescription Changes - 09/11/22 0800       Response to Exercise   Blood Pressure (Admit) 112/72    Blood Pressure (Exercise) 130/80    Blood Pressure (Exit) 118/70    Heart Rate (Admit) 76 bpm    Heart Rate (Exercise) 100 bpm    Heart Rate (Exit) 75 bpm    Rating of Perceived Exertion (Exercise) 8    Perceived Dyspnea (Exercise) 0    Symptoms 0    Comments Pt first day in teh [facility] program    Duration Progress to 30 minutes of  aerobic without signs/symptoms of physical distress    Intensity THRR unchanged      Progression   Progression Continue to progress workloads to maintain intensity without  signs/symptoms of physical distress.    Average METs 2.5      Resistance Training   Training Prescription Yes    Weight 5    Reps 10-15    Time 10 Minutes      Bike   Level 2    Watts 29    Minutes 15    METs 2.7      NuStep   Level 3    Minutes 15    METs 2.3             Social History   Tobacco Use  Smoking Status Former   Packs/day: 2.00   Years: 15.00   Additional pack years: 0.00   Total pack years: 30.00   Types: Cigarettes, Cigars   Quit date: 03/13/1993   Years since quitting: 29.5  Smokeless Tobacco Never  Tobacco Comments   occ Cigars    Goals Met:  Independence with exercise equipment Exercise tolerated well Personal goals reviewed No report of concerns or symptoms today Strength training completed today  Goals Unmet:  Not Applicable  Comments: Pt in cardiac rehab today. Pt tolerated light exercise without difficulty. VSS, telemetry- NSR asymptomatic. Medication list reconciled. Pt denies barriers to medicaiton compliance. PSYCHOSOCIAL ASSESSMENT:  PHQ-9 = 0.  Pt exhibits positive coping skills, hopeful outlook with supportive family. No psychosocial needs identified at this time, no psychosocial interventions necessary. Pt enjoys walking.  Pt oriented to exercise equipment and routine. Understanding verbalized.   QUALITY OF LIFE SCORE REVIEW   Pt completed Quality of Life survey as a participant in Cardiac Rehab.  Scores 19.0 or below are considered low.  Overall 29.42, Health and Function 28.67, socioeconomic 30, physiological and spiritual 29.14, family 30.   Will continue to monitor and intervene as necessary.       Dr. Armanda Magic is Medical Director for Cardiac Rehab at [facility].

## 2022-09-13 ENCOUNTER — Encounter (HOSPITAL_COMMUNITY)
Admission: RE | Admit: 2022-09-13 | Discharge: 2022-09-13 | Disposition: A | Discharge status: Home / Self Care | Payer: 59 | Source: Ambulatory Visit | Attending: Cardiology | Admitting: Cardiology

## 2022-09-13 DIAGNOSIS — Z955 Presence of coronary angioplasty implant and graft: Secondary | ICD-10-CM

## 2022-09-13 NOTE — Progress Notes (Signed)
Cardiac Individual Treatment Plan  Patient Details  Name: Brian Elliott MRN: [id number] Date of Birth: 05-10-1965 Referring Provider:   Flowsheet Row INTENSIVE CARDIAC REHAB ORIENT from 09/05/2022 in [facility]  Referring Provider Tessa Lerner, DO       Initial Encounter Date:  Flowsheet Row INTENSIVE CARDIAC REHAB ORIENT from 09/05/2022 in [facility]  Date 09/05/22       Visit Diagnosis: 08/04/22 DES mLAD, pLAD  Patient's Home Medications on Admission:  Current Outpatient Medications:    acetaminophen (TYLENOL) 500 MG tablet, Take 500 mg by mouth every 6 (six) hours as needed for moderate pain., Disp: , Rfl:    atorvastatin (LIPITOR) 40 MG tablet, Take 1 tablet (40 mg total) by mouth daily., Disp: 30 tablet, Rfl: 3   [employer] ASPIRIN LOW DOSE 81 MG tablet, Take 81 mg by mouth daily., Disp: , Rfl:    ezetimibe (ZETIA) 10 MG tablet, Take 1 tablet (10 mg total) by mouth daily., Disp: 90 tablet, Rfl: 3   famotidine (PEPCID) 20 MG tablet, Take 20 mg by mouth daily., Disp: , Rfl:    fexofenadine (ALLEGRA) 180 MG tablet, Take 180 mg by mouth daily., Disp: , Rfl:    isosorbide mononitrate (IMDUR) 30 MG 24 hr tablet, Take 1 tablet (30 mg total) by mouth at bedtime., Disp: 90 tablet, Rfl: 3   levothyroxine (SYNTHROID) 150 MCG tablet, Take 150 mcg by mouth daily., Disp: , Rfl:    losartan (COZAAR) 25 MG tablet, Take 0.5 tablets (12.5 mg total) by mouth at bedtime., Disp: 90 tablet, Rfl: 3   MAGNESIUM PO, Take 200 mg by mouth in the morning and at bedtime., Disp: , Rfl:    metoprolol succinate (TOPROL-XL) 25 MG 24 hr tablet, Take 1 tablet (25 mg total) by mouth daily. Take with or immediately following a meal., Disp: 30 tablet, Rfl: 3   Multiple Vitamin (MULTIVITAMIN) tablet, Take 1 tablet by mouth daily., Disp: , Rfl:    nitroGLYCERIN (NITROSTAT) 0.4 MG SL tablet, Place 0.4 mg  under the tongue every 5 (five) minutes as needed for chest pain., Disp: , Rfl:    Omega-3 Fatty Acids (FISH OIL PO), Take 1 capsule by mouth daily., Disp: , Rfl:    Omega-3 Fatty Acids (FISH OIL) 1200 MG CAPS, Take by mouth., Disp: , Rfl:    omeprazole (PRILOSEC) 40 MG capsule, Take 1 capsule (40 mg total) by mouth daily., Disp: 90 capsule, Rfl: 3   sertraline (ZOLOFT) 25 MG tablet, Take 1 tablet (25 mg total) by mouth daily., Disp: 90 tablet, Rfl: 3   ticagrelor (BRILINTA) 90 MG TABS tablet, Take 1 tablet (90 mg total) by mouth 2 (two) times daily., Disp: 60 tablet, Rfl: 3   vitamin E 180 MG (400 UNITS) capsule, Take 400 Units by mouth daily., Disp: , Rfl:    zinc gluconate 50 MG tablet, Take 50 mg by mouth in the morning and at bedtime., Disp: , Rfl:   Past Medical History: Past Medical History:  Diagnosis Date   Allergy    seasonal   Coronary artery disease    GERD (gastroesophageal reflux disease)    Hyperlipidemia    Hypothyroid    Sleep apnea    wears cpap    Tobacco Use: Social History   Tobacco Use  Smoking Status Former   Packs/day: 2.00   Years: 15.00   Additional pack years:  0.00   Total pack years: 30.00   Types: Cigarettes, Cigars   Quit date: 03/13/1993   Years since quitting: 29.5  Smokeless Tobacco Never  Tobacco Comments   occ Cigars    Labs: Review Flowsheet       Latest Ref Rng & Units 08/03/2022 08/04/2022  Labs for ITP Cardiac and Pulmonary Rehab  Cholestrol 0 - 200 mg/dL - 130   LDL (calc) 0 - 99 mg/dL - 90   Direct LDL 0 - 99 mg/dL - 97   HDL-C >86 mg/dL - 36   Trlycerides <578 mg/dL - 469   Hemoglobin G2X 4.8 - 5.6 % 5.9  -    Capillary Blood Glucose: No results found for: "GLUCAP"   Exercise Target Goals: Exercise Program Goal: Individual exercise prescription set using results from initial 6 min walk test and THRR while considering  patient's activity barriers and safety.   Exercise Prescription Goal: Initial exercise prescription  builds to 30-45 minutes a day of aerobic activity, 2-3 days per week.  Home exercise guidelines will be given to patient during program as part of exercise prescription that the participant will acknowledge.  Activity Barriers & Risk Stratification:  Activity Barriers & Cardiac Risk Stratification - 09/05/22 0907       Activity Barriers & Cardiac Risk Stratification   Activity Barriers Other (comment)    Comments Left knee bothers him occassionally.    Cardiac Risk Stratification Low             6 Minute Walk:  6 Minute Walk     Row Name 09/05/22 0917         6 Minute Walk   Phase Initial     Distance 1872 feet     Walk Time 6 minutes     # of Rest Breaks 0     MPH 3.54     METS 4.12     RPE 9     Perceived Dyspnea  0     VO2 Peak 14.43     Symptoms No     Resting HR 81 bpm     Resting BP 108/70     Resting Oxygen Saturation  97 %     Exercise Oxygen Saturation  during 6 min walk 98 %     Max Ex. HR 100 bpm     Max Ex. BP 110/64     2 Minute Post BP 108/72              Oxygen Initial Assessment:   Oxygen Re-Evaluation:   Oxygen Discharge (Final Oxygen Re-Evaluation):   Initial Exercise Prescription:  Initial Exercise Prescription - 09/05/22 1000       Date of Initial Exercise RX and Referring Provider   Date 09/05/22    Referring Provider Tessa Lerner, DO    Expected Discharge Date 11/17/22      Bike   Level 2    Watts 50    METs 3.8      NuStep   Level 3    SPM 85    Minutes 15    METs 3.2      Prescription Details   Frequency (times per week) 3    Duration Progress to 30 minutes of continuous aerobic without signs/symptoms of physical distress      Intensity   THRR 40-80% of Max Heartrate 65-130    Ratings of Perceived Exertion 11-13    Perceived Dyspnea 0-4      Progression  Progression Continue to progress workloads to maintain intensity without signs/symptoms of physical distress.      Resistance Training   Training  Prescription Yes    Weight 5 lbs    Reps 10-15             Perform Capillary Blood Glucose checks as needed.  Exercise Prescription Changes:   Exercise Prescription Changes     Row Name 09/11/22 0800             Response to Exercise   Blood Pressure (Admit) 112/72       Blood Pressure (Exercise) 130/80       Blood Pressure (Exit) 118/70       Heart Rate (Admit) 76 bpm       Heart Rate (Exercise) 100 bpm       Heart Rate (Exit) 75 bpm       Rating of Perceived Exertion (Exercise) 8       Perceived Dyspnea (Exercise) 0       Symptoms 0       Comments Pt first day in teh Pritikin ICR program       Duration Progress to 30 minutes of  aerobic without signs/symptoms of physical distress       Intensity THRR unchanged         Progression   Progression Continue to progress workloads to maintain intensity without signs/symptoms of physical distress.       Average METs 2.5         Resistance Training   Training Prescription Yes       Weight 5       Reps 10-15       Time 10 Minutes         Bike   Level 2       Watts 29       Minutes 15       METs 2.7         NuStep   Level 3       Minutes 15       METs 2.3                Exercise Comments:   Exercise Comments     Row Name 09/11/22 0848           Exercise Comments Pt first day in the CRP2 program. Pt tolerated exercise well with an average MET level of 2.5. Pt is off to a good start and is learning his THRR, RPE and ExRx. Will continue to monitor pt and progress workloads as tolerated without sign or symptom                Exercise Goals and Review:   Exercise Goals     Row Name 09/05/22 0855             Exercise Goals   Increase Physical Activity Yes       Intervention Provide advice, education, support and counseling about physical activity/exercise needs.;Develop an individualized exercise prescription for aerobic and resistive training based on initial evaluation findings, risk  stratification, comorbidities and participant's personal goals.       Expected Outcomes Short Term: Attend rehab on a regular basis to increase amount of physical activity.;Long Term: Exercising regularly at least 3-5 days a week.;Long Term: Add in home exercise to make exercise part of routine and to increase amount of physical activity.       Increase Strength and Stamina Yes  Intervention Provide advice, education, support and counseling about physical activity/exercise needs.;Develop an individualized exercise prescription for aerobic and resistive training based on initial evaluation findings, risk stratification, comorbidities and participant's personal goals.       Expected Outcomes Short Term: Increase workloads from initial exercise prescription for resistance, speed, and METs.;Short Term: Perform resistance training exercises routinely during rehab and add in resistance training at home;Long Term: Improve cardiorespiratory fitness, muscular endurance and strength as measured by increased METs and functional capacity ( )       Able to understand and use rate of perceived exertion (RPE) scale Yes       Intervention Provide education and explanation on how to use RPE scale       Expected Outcomes Short Term: Able to use RPE daily in rehab to express subjective intensity level;Long Term:  Able to use RPE to guide intensity level when exercising independently       Knowledge and understanding of Target Heart Rate Range (THRR) Yes       Intervention Provide education and explanation of THRR including how the numbers were predicted and where they are located for reference       Expected Outcomes Short Term: Able to state/look up THRR;Short Term: Able to use daily as guideline for intensity in rehab;Long Term: Able to use THRR to govern intensity when exercising independently       Able to check pulse independently Yes       Intervention Provide education and demonstration on how to check pulse  in carotid and radial arteries.;Review the importance of being able to check your own pulse for safety during independent exercise       Expected Outcomes Short Term: Able to explain why pulse checking is important during independent exercise;Long Term: Able to check pulse independently and accurately       Understanding of Exercise Prescription Yes       Intervention Provide education, explanation, and written materials on patient's individual exercise prescription       Expected Outcomes Short Term: Able to explain program exercise prescription;Long Term: Able to explain home exercise prescription to exercise independently                Exercise Goals Re-Evaluation :  Exercise Goals Re-Evaluation     Row Name 09/11/22 0847             Exercise Goal Re-Evaluation   Exercise Goals Review Increase Physical Activity;Understanding of Exercise Prescription;Increase Strength and Stamina;Knowledge and understanding of Target Heart Rate Range (THRR);Able to understand and use rate of perceived exertion (RPE) scale       Comments Pt first day in the CRP2 program. Pt tolerated exercise well with an average MET level of 2.5. Pt is off to a good start and is learning his THRR, RPE and ExRx.       Expected Outcomes Will continue to monitor pt and progress workloads as tolerated without sign or symptom                Discharge Exercise Prescription (Final Exercise Prescription Changes):  Exercise Prescription Changes - 09/11/22 0800       Response to Exercise   Blood Pressure (Admit) 112/72    Blood Pressure (Exercise) 130/80    Blood Pressure (Exit) 118/70    Heart Rate (Admit) 76 bpm    Heart Rate (Exercise) 100 bpm    Heart Rate (Exit) 75 bpm    Rating of Perceived Exertion (Exercise) 8  Perceived Dyspnea (Exercise) 0    Symptoms 0    Comments Pt first day in teh Pritikin ICR program    Duration Progress to 30 minutes of  aerobic without signs/symptoms of physical distress     Intensity THRR unchanged      Progression   Progression Continue to progress workloads to maintain intensity without signs/symptoms of physical distress.    Average METs 2.5      Resistance Training   Training Prescription Yes    Weight 5    Reps 10-15    Time 10 Minutes      Bike   Level 2    Watts 29    Minutes 15    METs 2.7      NuStep   Level 3    Minutes 15    METs 2.3             Nutrition:  Target Goals: Understanding of nutrition guidelines, daily intake of sodium 1500mg , cholesterol 200mg , calories 30% from fat and 7% or less from saturated fats, daily to have 5 or more servings of fruits and vegetables.  Biometrics:  Pre Biometrics - 09/05/22 0800       Pre Biometrics   Waist Circumference 45.75 inches    Hip Circumference 42 inches    Waist to Hip Ratio 1.09 %    Triceps Skinfold 15 mm    % Body Fat 31.5 %    Grip Strength 58 kg    Flexibility 0 in    Single Leg Stand 28.68 seconds              Nutrition Therapy Plan and Nutrition Goals:  Nutrition Therapy & Goals - 09/11/22 1049       Nutrition Therapy   Diet Heart Healthy Diet    Drug/Food Interactions Statins/Certain Fruits      Personal Nutrition Goals   Nutrition Goal Patient to identify strategies for reducing cardiovascular risk by attending the [facility] education and nutrition series weekly.    Personal Goal #2 Patient to improve diet quality by using the plate method as a guide for meal planning to include lean protein/plant protein, fruits, vegetables, whole grains, nonfat dairy as part of a well-balanced diet.    Personal Goal #3 Patient to limit sodium to 1500mg  per day    Comments Patient will benefit from participation in intensive cardiac rehab for nutrition, exercise, and lifestyle modification.      Intervention Plan   Intervention Prescribe, educate and counsel regarding individualized specific dietary modifications aiming towards targeted core components such as  weight, hypertension, lipid management, diabetes, heart failure and other comorbidities.;Nutrition handout(s) given to patient.    Expected Outcomes Short Term Goal: Understand basic principles of dietary content, such as calories, fat, sodium, cholesterol and nutrients.;Long Term Goal: Adherence to prescribed nutrition plan.             Nutrition Assessments:  MEDIFICTS Score Key: ?70 Need to make dietary changes  40-70 Heart Healthy Diet ? 40 Therapeutic Level Cholesterol Diet    Picture Your Plate Scores: <16 Unhealthy dietary pattern with much room for improvement. 41-50 Dietary pattern unlikely to meet recommendations for good health and room for improvement. 51-60 More healthful dietary pattern, with some room for improvement.  >60 Healthy dietary pattern, although there may be some specific behaviors that could be improved.    Nutrition Goals Re-Evaluation:  Nutrition Goals Re-Evaluation     Row Name 09/11/22 1049  Goals   Current Weight 227 lb 1.2 oz (103 kg)       Comment lipids WNL-LDL 90, HDL 36, A1c 5.9       Expected Outcome Patient will benefit from participation in intensive cardiac rehab for nutrition, exercise, and lifestyle modification.                Nutrition Goals Re-Evaluation:  Nutrition Goals Re-Evaluation     Row Name 09/11/22 1049             Goals   Current Weight 227 lb 1.2 oz (103 kg)       Comment lipids WNL-LDL 90, HDL 36, A1c 5.9       Expected Outcome Patient will benefit from participation in intensive cardiac rehab for nutrition, exercise, and lifestyle modification.                Nutrition Goals Discharge (Final Nutrition Goals Re-Evaluation):  Nutrition Goals Re-Evaluation - 09/11/22 1049       Goals   Current Weight 227 lb 1.2 oz (103 kg)    Comment lipids WNL-LDL 90, HDL 36, A1c 5.9    Expected Outcome Patient will benefit from participation in intensive cardiac rehab for nutrition, exercise,  and lifestyle modification.             Psychosocial: Target Goals: Acknowledge presence or absence of significant depression and/or stress, maximize coping skills, provide positive support system. Participant is able to verbalize types and ability to use techniques and skills needed for reducing stress and depression.  Initial Review & Psychosocial Screening:  Initial Psych Review & Screening - 09/05/22 0927       Initial Review   Current issues with None Identified      Family Dynamics   Good Support System? Yes    Comments Pt stated he has a good support system, and he has a positive attitude toward cardiac rehab.      Barriers   Psychosocial barriers to participate in program There are no identifiable barriers or psychosocial needs.      Screening Interventions   Interventions Encouraged to exercise;Provide feedback about the scores to participant    Expected Outcomes Long Term Goal: Stressors or current issues are controlled or eliminated.;Long Term goal: The participant improves quality of Life and PHQ9 Scores as seen by post scores and/or verbalization of changes             Quality of Life Scores:  Quality of Life - 09/05/22 1004       Quality of Life   Select Quality of Life      Quality of Life Scores   Health/Function Pre 28.67 %    Socioeconomic Pre 30 %    Psych/Spiritual Pre 29.14 %    Family Pre 30 %    GLOBAL Pre 29.24 %            Scores of 19 and below usually indicate a poorer quality of life in these areas.  A difference of  2-3 points is a clinically meaningful difference.  A difference of 2-3 points in the total score of the Quality of Life Index has been associated with significant improvement in overall quality of life, self-image, physical symptoms, and general health in studies assessing change in quality of life.  PHQ-9: Review Flowsheet       09/05/2022  Depression screen PHQ 2/9  Decreased Interest 0  Down, Depressed, Hopeless  0  PHQ - 2 Score 0  Altered sleeping 0  Tired, decreased energy 0  Change in appetite 0  Feeling bad or failure about yourself  0  Trouble concentrating 0  Moving slowly or fidgety/restless 0  Suicidal thoughts 0  PHQ-9 Score 0   Interpretation of Total Score  Total Score Depression Severity:  1-4 = Minimal depression, 5-9 = Mild depression, 10-14 = Moderate depression, 15-19 = Moderately severe depression, 20-27 = Severe depression   Psychosocial Evaluation and Intervention:  Psychosocial Evaluation - 09/11/22 1322       Psychosocial Evaluation & Interventions   Interventions Encouraged to exercise with the program and follow exercise prescription    Continue Psychosocial Services  No Follow up required             Psychosocial Re-Evaluation:   Psychosocial Discharge (Final Psychosocial Re-Evaluation):   Vocational Rehabilitation: Provide vocational rehab assistance to qualifying candidates.   Vocational Rehab Evaluation & Intervention:  Vocational Rehab - 09/05/22 0953       Initial Vocational Rehab Evaluation & Intervention   Assessment shows need for Vocational Rehabilitation No             Education: Education Goals: Education classes will be provided on a weekly basis, covering required topics. Participant will state understanding/return demonstration of topics presented.    Education     Row Name 09/11/22 0900     Education   Cardiac Education Topics Pritikin   Select Core Videos     Core Videos   Educator Dietitian   Select Nutrition   Nutrition Calorie Density   Instruction Review Code 1- Verbalizes Understanding   Class Start Time 0815   Class Stop Time 0857   Class Time Calculation (min) 42 min    Row Name 09/13/22 1000     Education   Cardiac Education Topics Pritikin   Orthoptist   Educator Dietitian   Weekly Topic Efficiency Cooking - Meals in a Snap   Instruction Review Code 1- Verbalizes  Understanding   Class Start Time 0815   Class Stop Time 0850   Class Time Calculation (min) 35 min            Core Videos: Exercise    Move It!  Clinical staff conducted group or individual video education with verbal and written material and guidebook.  Patient learns the recommended Pritikin exercise program. Exercise with the goal of living a long, healthy life. Some of the health benefits of exercise include controlled diabetes, healthier blood pressure levels, improved cholesterol levels, improved heart and lung capacity, improved sleep, and better body composition. Everyone should speak with their doctor before starting or changing an exercise routine.  Biomechanical Limitations Clinical staff conducted group or individual video education with verbal and written material and guidebook.  Patient learns how biomechanical limitations can impact exercise and how we can mitigate and possibly overcome limitations to have an impactful and balanced exercise routine.  Body Composition Clinical staff conducted group or individual video education with verbal and written material and guidebook.  Patient learns that body composition (ratio of muscle mass to fat mass) is a key component to assessing overall fitness, rather than body weight alone. Increased fat mass, especially visceral belly fat, can put [place] at increased risk for metabolic syndrome, type 2 diabetes, heart disease, and even death. It is recommended to combine diet and exercise (cardiovascular and resistance training) to improve your body composition. Seek guidance from your physician and exercise physiologist before  implementing an exercise routine.  Exercise Action Plan Clinical staff conducted group or individual video education with verbal and written material and guidebook.  Patient learns the recommended strategies to achieve and enjoy long-term exercise adherence, including variety, self-motivation, self-efficacy, and positive  decision making. Benefits of exercise include fitness, good health, weight management, more energy, better sleep, less stress, and overall well-being.  Medical   Heart Disease Risk Reduction Clinical staff conducted group or individual video education with verbal and written material and guidebook.  Patient learns our heart is our most vital organ as it circulates oxygen, nutrients, white blood cells, and hormones throughout the entire body, and carries waste away. Data supports a plant-based eating plan like the Pritikin Program for its effectiveness in slowing progression of and reversing heart disease. The video provides a number of recommendations to address heart disease.   Metabolic Syndrome and Belly Fat  Clinical staff conducted group or individual video education with verbal and written material and guidebook.  Patient learns what metabolic syndrome is, how it leads to heart disease, and how one can reverse it and keep it from coming back. You have metabolic syndrome if you have 3 of the following 5 criteria: abdominal obesity, high blood pressure, high triglycerides, low HDL cholesterol, and high blood sugar.  Hypertension and Heart Disease Clinical staff conducted group or individual video education with verbal and written material and guidebook.  Patient learns that high blood pressure, or hypertension, is very common in the [place]. Hypertension is largely due to excessive salt intake, but other important risk factors include being overweight, physical inactivity, drinking too much alcohol, smoking, and not eating enough potassium from fruits and vegetables. High blood pressure is a leading risk factor for heart attack, stroke, congestive heart failure, dementia, kidney failure, and premature death. Long-term effects of excessive salt intake include stiffening of the arteries and thickening of heart muscle and organ damage. Recommendations include ways to reduce hypertension and the  risk of heart disease.  Diseases of Our Time - Focusing on Diabetes Clinical staff conducted group or individual video education with verbal and written material and guidebook.  Patient learns why the best way to stop diseases of our time is prevention, through food and other lifestyle changes. Medicine (such as prescription pills and surgeries) is often only a Band-Aid on the problem, not a long-term solution. Most common diseases of our time include obesity, type 2 diabetes, hypertension, heart disease, and cancer. The Pritikin Program is recommended and has been proven to help reduce, reverse, and/or prevent the damaging effects of metabolic syndrome.  Nutrition   Overview of the Pritikin Eating Plan  Clinical staff conducted group or individual video education with verbal and written material and guidebook.  Patient learns about the Pritikin Eating Plan for disease risk reduction. The Pritikin Eating Plan emphasizes a wide variety of unrefined, minimally-processed carbohydrates, like fruits, vegetables, whole grains, and legumes. Go, Caution, and Stop food choices are explained. Plant-based and lean animal proteins are emphasized. Rationale provided for low sodium intake for blood pressure control, low added sugars for blood sugar stabilization, and low added fats and oils for coronary artery disease risk reduction and weight management.  Calorie Density  Clinical staff conducted group or individual video education with verbal and written material and guidebook.  Patient learns about calorie density and how it impacts the Pritikin Eating Plan. Knowing the characteristics of the food you choose will help you decide whether those foods will lead to weight gain  or weight loss, and whether you want to consume more or less of them. Weight loss is usually a side effect of the Pritikin Eating Plan because of its focus on low calorie-dense foods.  Label Reading  Clinical staff conducted group or  individual video education with verbal and written material and guidebook.  Patient learns about the Pritikin recommended label reading guidelines and corresponding recommendations regarding calorie density, added sugars, sodium content, and whole grains.  Dining Out - Part 1  Clinical staff conducted group or individual video education with verbal and written material and guidebook.  Patient learns that restaurant meals can be sabotaging because they can be so high in calories, fat, sodium, and/or sugar. Patient learns recommended strategies on how to positively address this and avoid unhealthy pitfalls.  Facts on Fats  Clinical staff conducted group or individual video education with verbal and written material and guidebook.  Patient learns that lifestyle modifications can be just as effective, if not more so, as many medications for lowering your risk of heart disease. A Pritikin lifestyle can help to reduce your risk of inflammation and atherosclerosis (cholesterol build-up, or plaque, in the artery walls). Lifestyle interventions such as dietary choices and physical activity address the cause of atherosclerosis. A review of the types of fats and their impact on blood cholesterol levels, along with dietary recommendations to reduce fat intake is also included.  Nutrition Action Plan  Clinical staff conducted group or individual video education with verbal and written material and guidebook.  Patient learns how to incorporate Pritikin recommendations into their lifestyle. Recommendations include planning and keeping personal health goals in mind as an important part of their success.  Healthy Mind-Set    Healthy Minds, Bodies, Hearts  Clinical staff conducted group or individual video education with verbal and written material and guidebook.  Patient learns how to identify when they are stressed. Video will discuss the impact of that stress, as well as the many benefits of stress management.  Patient will also be introduced to stress management techniques. The way we think, act, and feel has an impact on our hearts.  How Our Thoughts Can Heal Our Hearts  Clinical staff conducted group or individual video education with verbal and written material and guidebook.  Patient learns that negative thoughts can cause depression and anxiety. This can result in negative lifestyle behavior and serious health problems. Cognitive behavioral therapy is an effective method to help control our thoughts in order to change and improve our emotional outlook.  Additional Videos:  Exercise    Improving Performance  Clinical staff conducted group or individual video education with verbal and written material and guidebook.  Patient learns to use a non-linear approach by alternating intensity levels and lengths of time spent exercising to help burn more calories and lose more body fat. Cardiovascular exercise helps improve heart health, metabolism, hormonal balance, blood sugar control, and recovery from fatigue. Resistance training improves strength, endurance, balance, coordination, reaction time, metabolism, and muscle mass. Flexibility exercise improves circulation, posture, and balance. Seek guidance from your physician and exercise physiologist before implementing an exercise routine and learn your capabilities and proper form for all exercise.  Introduction to Yoga  Clinical staff conducted group or individual video education with verbal and written material and guidebook.  Patient learns about yoga, a discipline of the coming together of mind, breath, and body. The benefits of yoga include improved flexibility, improved range of motion, better posture and core strength, increased lung function, weight loss,  and positive self-image. Yoga's heart health benefits include lowered blood pressure, healthier heart rate, decreased cholesterol and triglyceride levels, improved immune function, and reduced stress.  Seek guidance from your physician and exercise physiologist before implementing an exercise routine and learn your capabilities and proper form for all exercise.  Medical   Aging: Enhancing Your Quality of Life  Clinical staff conducted group or individual video education with verbal and written material and guidebook.  Patient learns key strategies and recommendations to stay in good physical health and enhance quality of life, such as prevention strategies, having an advocate, securing a Health Care Proxy and Power of Attorney, and keeping a list of medications and system for tracking them. It also discusses how to avoid risk for bone loss.  Biology of Weight Control  Clinical staff conducted group or individual video education with verbal and written material and guidebook.  Patient learns that weight gain occurs because we consume more calories than we burn (eating more, moving less). Even if your body weight is normal, you may have higher ratios of fat compared to muscle mass. Too much body fat puts you at increased risk for cardiovascular disease, heart attack, stroke, type 2 diabetes, and obesity-related cancers. In addition to exercise, following the Pritikin Eating Plan can help reduce your risk.  Decoding Lab Results  Clinical staff conducted group or individual video education with verbal and written material and guidebook.  Patient learns that lab test reflects one measurement whose values change over time and are influenced by many factors, including medication, stress, sleep, exercise, food, hydration, pre-existing medical conditions, and more. It is recommended to use the knowledge from this video to become more involved with your lab results and evaluate your numbers to speak with your doctor.   Diseases of Our Time - Overview  Clinical staff conducted group or individual video education with verbal and written material and guidebook.  Patient learns that according to the CDC, 50%  to 70% of chronic diseases (such as obesity, type 2 diabetes, elevated lipids, hypertension, and heart disease) are avoidable through lifestyle improvements including healthier food choices, listening to satiety cues, and increased physical activity.  Sleep Disorders Clinical staff conducted group or individual video education with verbal and written material and guidebook.  Patient learns how good quality and duration of sleep are important to overall health and well-being. Patient also learns about sleep disorders and how they impact health along with recommendations to address them, including discussing with a physician.  Nutrition  Dining Out - Part 2 Clinical staff conducted group or individual video education with verbal and written material and guidebook.  Patient learns how to plan ahead and communicate in order to maximize their dining experience in a healthy and nutritious manner. Included are recommended food choices based on the type of restaurant the patient is visiting.   Fueling a Banker conducted group or individual video education with verbal and written material and guidebook.  There is a strong connection between our food choices and our health. Diseases like obesity and type 2 diabetes are very prevalent and are in large-part due to lifestyle choices. The Pritikin Eating Plan provides plenty of food and hunger-curbing satisfaction. It is easy to follow, affordable, and helps reduce health risks.  Menu Workshop  Clinical staff conducted group or individual video education with verbal and written material and guidebook.  Patient learns that restaurant meals can sabotage health goals because they are often packed with calories, fat, sodium,  and sugar. Recommendations include strategies to plan ahead and to communicate with the manager, chef, or server to help order a healthier meal.  Planning Your Eating Strategy  Clinical staff conducted group or individual  video education with verbal and written material and guidebook.  Patient learns about the Pritikin Eating Plan and its benefit of reducing the risk of disease. The Pritikin Eating Plan does not focus on calories. Instead, it emphasizes high-quality, nutrient-rich foods. By knowing the characteristics of the foods, we choose, we can determine their calorie density and make informed decisions.  Targeting Your Nutrition Priorities  Clinical staff conducted group or individual video education with verbal and written material and guidebook.  Patient learns that lifestyle habits have a tremendous impact on disease risk and progression. This video provides eating and physical activity recommendations based on your personal health goals, such as reducing LDL cholesterol, losing weight, preventing or controlling type 2 diabetes, and reducing high blood pressure.  Vitamins and Minerals  Clinical staff conducted group or individual video education with verbal and written material and guidebook.  Patient learns different ways to obtain key vitamins and minerals, including through a recommended healthy diet. It is important to discuss all supplements you take with your doctor.   Healthy Mind-Set    Smoking Cessation  Clinical staff conducted group or individual video education with verbal and written material and guidebook.  Patient learns that cigarette smoking and tobacco addiction pose a serious health risk which affects millions of people. Stopping smoking will significantly reduce the risk of heart disease, lung disease, and many forms of cancer. Recommended strategies for quitting are covered, including working with your doctor to develop a successful plan.  Culinary   Becoming a Set designer conducted group or individual video education with verbal and written material and guidebook.  Patient learns that cooking at home can be healthy, cost-effective, quick, and puts them in control.  Keys to cooking healthy recipes will include looking at your recipe, assessing your equipment needs, planning ahead, making it simple, choosing cost-effective seasonal ingredients, and limiting the use of added fats, salts, and sugars.  Cooking - Breakfast and Snacks  Clinical staff conducted group or individual video education with verbal and written material and guidebook.  Patient learns how important breakfast is to satiety and nutrition through the entire day. Recommendations include key foods to eat during breakfast to help stabilize blood sugar levels and to prevent overeating at meals later in the day. Planning ahead is also a key component.  Cooking - Educational psychologist conducted group or individual video education with verbal and written material and guidebook.  Patient learns eating strategies to improve overall health, including an approach to cook more at home. Recommendations include thinking of animal protein as a side on your plate rather than center stage and focusing instead on lower calorie dense options like vegetables, fruits, whole grains, and plant-based proteins, such as beans. Making sauces in large quantities to freeze for later and leaving the skin on your vegetables are also recommended to maximize your experience.  Cooking - Healthy Salads and Dressing Clinical staff conducted group or individual video education with verbal and written material and guidebook.  Patient learns that vegetables, fruits, whole grains, and legumes are the foundations of the Pritikin Eating Plan. Recommendations include how to incorporate each of these in flavorful and healthy salads, and how to create homemade salad dressings. Proper handling of ingredients is also covered. Cooking -  Soups and Desserts  Cooking - Soups and Desserts Clinical staff conducted group or individual video education with verbal and written material and guidebook.  Patient learns that Pritikin soups and  desserts make for easy, nutritious, and delicious snacks and meal components that are low in sodium, fat, sugar, and calorie density, while high in vitamins, minerals, and filling fiber. Recommendations include simple and healthy ideas for soups and desserts.   Overview     The Pritikin Solution Program Overview Clinical staff conducted group or individual video education with verbal and written material and guidebook.  Patient learns that the results of the Pritikin Program have been documented in more than 100 articles published in peer-reviewed journals, and the benefits include reducing risk factors for (and, in some cases, even reversing) high cholesterol, high blood pressure, type 2 diabetes, obesity, and more! An overview of the three key pillars of the Pritikin Program will be covered: eating well, doing regular exercise, and having a healthy mind-set.  WORKSHOPS  Exercise: Exercise Basics: Building Your Action Plan Clinical staff led group instruction and group discussion with PowerPoint presentation and patient guidebook. To enhance the learning environment the use of posters, models and videos may be added. At the conclusion of this workshop, patients will comprehend the difference between physical activity and exercise, as well as the benefits of incorporating both, into their routine. Patients will understand the FITT (Frequency, Intensity, Time, and Type) principle and how to use it to build an exercise action plan. In addition, safety concerns and other considerations for exercise and cardiac rehab will be addressed by the presenter. The purpose of this lesson is to promote a comprehensive and effective weekly exercise routine in order to improve patients' overall level of fitness.   Managing Heart Disease: Your Path to a Healthier Heart Clinical staff led group instruction and group discussion with PowerPoint presentation and patient guidebook. To enhance the learning environment  the use of posters, models and videos may be added.At the conclusion of this workshop, patients will understand the anatomy and physiology of the heart. Additionally, they will understand how Pritikin's three pillars impact the risk factors, the progression, and the management of heart disease.  The purpose of this lesson is to provide a high-level overview of the heart, heart disease, and how the Pritikin lifestyle positively impacts risk factors.  Exercise Biomechanics Clinical staff led group instruction and group discussion with PowerPoint presentation and patient guidebook. To enhance the learning environment the use of posters, models and videos may be added. Patients will learn how the structural parts of their bodies function and how these functions impact their daily activities, movement, and exercise. Patients will learn how to promote a neutral spine, learn how to manage pain, and identify ways to improve their physical movement in order to promote healthy living. The purpose of this lesson is to expose patients to common physical limitations that impact physical activity. Participants will learn practical ways to adapt and manage aches and pains, and to minimize their effect on regular exercise. Patients will learn how to maintain good posture while sitting, walking, and lifting.  Balance Training and Fall Prevention  Clinical staff led group instruction and group discussion with PowerPoint presentation and patient guidebook. To enhance the learning environment the use of posters, models and videos may be added. At the conclusion of this workshop, patients will understand the importance of their sensorimotor skills (vision, proprioception, and the vestibular system) in maintaining their ability to balance as they age. Patients  will apply a variety of balancing exercises that are appropriate for their current level of function. Patients will understand the common causes for  poor balance, possible solutions to these problems, and ways to modify their physical environment in order to minimize their fall risk. The purpose of this lesson is to teach patients about the importance of maintaining balance as they age and ways to minimize their risk of falling.  WORKSHOPS   Nutrition:  Fueling a Ship broker led group instruction and group discussion with PowerPoint presentation and patient guidebook. To enhance the learning environment the use of posters, models and videos may be added. Patients will review the foundational principles of the Pritikin Eating Plan and understand what constitutes a serving size in each of the food groups. Patients will also learn Pritikin-friendly foods that are better choices when away from home and review make-ahead meal and snack options. Calorie density will be reviewed and applied to three nutrition priorities: weight maintenance, weight loss, and weight gain. The purpose of this lesson is to reinforce (in a group setting) the key concepts around what patients are recommended to eat and how to apply these guidelines when away from home by planning and selecting Pritikin-friendly options. Patients will understand how calorie density may be adjusted for different weight management goals.  Mindful Eating  Clinical staff led group instruction and group discussion with PowerPoint presentation and patient guidebook. To enhance the learning environment the use of posters, models and videos may be added. Patients will briefly review the concepts of the Pritikin Eating Plan and the importance of low-calorie dense foods. The concept of mindful eating will be introduced as well as the importance of paying attention to internal hunger signals. Triggers for non-hunger eating and techniques for dealing with triggers will be explored. The purpose of this lesson is to provide patients with the opportunity to review the basic principles of the  Pritikin Eating Plan, discuss the value of eating mindfully and how to measure internal cues of hunger and fullness using the Hunger Scale. Patients will also discuss reasons for non-hunger eating and learn strategies to use for controlling emotional eating.  Targeting Your Nutrition Priorities Clinical staff led group instruction and group discussion with PowerPoint presentation and patient guidebook. To enhance the learning environment the use of posters, models and videos may be added. Patients will learn how to determine their genetic susceptibility to disease by reviewing their family history. Patients will gain insight into the importance of diet as part of an overall healthy lifestyle in mitigating the impact of genetics and other environmental insults. The purpose of this lesson is to provide patients with the opportunity to assess their personal nutrition priorities by looking at their family history, their own health history and current risk factors. Patients will also be able to discuss ways of prioritizing and modifying the Pritikin Eating Plan for their highest risk areas  Menu  Clinical staff led group instruction and group discussion with PowerPoint presentation and patient guidebook. To enhance the learning environment the use of posters, models and videos may be added. Using menus brought in from [employer], or printed from [employer], patients will apply the Pritikin dining out guidelines that were presented in the [employer] video. Patients will also be able to practice these guidelines in a variety of provided scenarios. The purpose of this lesson is to provide patients with the opportunity to practice hands-on learning of the [employer] guidelines with actual menus  and practice scenarios.  Label Reading Clinical staff led group instruction and group discussion with PowerPoint presentation and patient guidebook. To enhance the learning environment  the use of posters, models and videos may be added. Patients will review and discuss the Pritikin label reading guidelines presented in Pritikin's Label Reading Educational series video. Using fool labels brought in from local grocery stores and markets, patients will apply the label reading guidelines and determine if the packaged food meet the Pritikin guidelines. The purpose of this lesson is to provide patients with the opportunity to review, discuss, and practice hands-on learning of the Pritikin Label Reading guidelines with actual packaged food labels. Cooking School  Pritikin's [employer] are designed to teach patients ways to prepare quick, simple, and affordable recipes at home. The importance of nutrition's role in chronic disease risk reduction is reflected in its emphasis in the overall Pritikin program. By learning how to prepare essential core Pritikin Eating Plan recipes, patients will increase control over what they eat; be able to customize the flavor of foods without the use of added salt, sugar, or fat; and improve the quality of the food they consume. By learning a set of core recipes which are easily assembled, quickly prepared, and affordable, patients are more likely to prepare more healthy foods at home. These workshops focus on convenient breakfasts, simple entres, side dishes, and desserts which can be prepared with minimal effort and are consistent with nutrition recommendations for cardiovascular risk reduction. Cooking [employer] are taught by a Armed forces logistics/support/administrative officer (RD) who has been trained by the [employer]. The chef or RD has a clear understanding of the importance of minimizing - if not completely eliminating - added fat, sugar, and sodium in recipes. Throughout the series of Cooking School Workshop sessions, patients will learn about healthy ingredients and efficient methods of cooking to build confidence in their capability to  prepare    Cooking School weekly topics:  Adding Flavor- Sodium-Free  Fast and Healthy Breakfasts  Powerhouse Plant-Based Proteins  Satisfying Salads and Dressings  Simple Sides and Sauces  International Cuisine-Spotlight on the [employer] Zones  Delicious Desserts  Savory Soups  [employer] - Meals in a Astronomer Appetizers and Snacks  Comforting Weekend Breakfasts  One-Pot Wonders   Fast Evening Meals  Landscape architect Your Pritikin Plate  WORKSHOPS   Healthy Mindset (Psychosocial):  Focused Goals, Sustainable Changes Clinical staff led group instruction and group discussion with PowerPoint presentation and patient guidebook. To enhance the learning environment the use of posters, models and videos may be added. Patients will be able to apply effective goal setting strategies to establish at least one personal goal, and then take consistent, meaningful action toward that goal. They will learn to identify common barriers to achieving personal goals and develop strategies to overcome them. Patients will also gain an understanding of how our mind-set can impact our ability to achieve goals and the importance of cultivating a positive and growth-oriented mind-set. The purpose of this lesson is to provide patients with a deeper understanding of how to set and achieve personal goals, as well as the tools and strategies needed to overcome common obstacles which may arise along the way.  From Head to Heart: The Power of a Healthy Outlook  Clinical staff led group instruction and group discussion with PowerPoint presentation and patient guidebook. To enhance the learning environment the use of posters, models and videos may be added. Patients will be  able to recognize and describe the impact of emotions and mood on physical health. They will discover the importance of self-care and explore self-care practices which may work for them. Patients will also learn how to utilize  the 4 C's to cultivate a healthier outlook and better manage stress and challenges. The purpose of this lesson is to demonstrate to patients how a healthy outlook is an essential part of maintaining good health, especially as they continue their cardiac rehab journey.  Healthy Sleep for a Healthy Heart Clinical staff led group instruction and group discussion with PowerPoint presentation and patient guidebook. To enhance the learning environment the use of posters, models and videos may be added. At the conclusion of this workshop, patients will be able to demonstrate knowledge of the importance of sleep to overall health, well-being, and quality of life. They will understand the symptoms of, and treatments for, common sleep disorders. Patients will also be able to identify daytime and nighttime behaviors which impact sleep, and they will be able to apply these tools to help manage sleep-related challenges. The purpose of this lesson is to provide patients with a general overview of sleep and outline the importance of quality sleep. Patients will learn about a few of the most common sleep disorders. Patients will also be introduced to the concept of "sleep hygiene," and discover ways to self-manage certain sleeping problems through simple daily behavior changes. Finally, the workshop will motivate patients by clarifying the links between quality sleep and their goals of heart-healthy living.   Recognizing and Reducing Stress Clinical staff led group instruction and group discussion with PowerPoint presentation and patient guidebook. To enhance the learning environment the use of posters, models and videos may be added. At the conclusion of this workshop, patients will be able to understand the types of stress reactions, differentiate between acute and chronic stress, and recognize the impact that chronic stress has on their health. They will also be able to apply different coping mechanisms, such as reframing  negative self-talk. Patients will have the opportunity to practice a variety of stress management techniques, such as deep abdominal breathing, progressive muscle relaxation, and/or guided imagery.  The purpose of this lesson is to educate patients on the role of stress in their lives and to provide healthy techniques for coping with it.  Learning Barriers/Preferences:  Learning Barriers/Preferences - 09/05/22 0951       Learning Barriers/Preferences   Learning Barriers None    Learning Preferences Video;Skilled Demonstration             Education Topics:  Knowledge Questionnaire Score:  Knowledge Questionnaire Score - 09/05/22 1004       Knowledge Questionnaire Score   Pre Score 18/24             Core Components/Risk Factors/Patient Goals at Admission:  Personal Goals and Risk Factors at Admission - 09/05/22 1011       Core Components/Risk Factors/Patient Goals on Admission    Weight Management Yes;Obesity;Weight Loss    Intervention Weight Management/Obesity: Establish reasonable short term and long term weight goals.;Obesity: Provide education and appropriate resources to help participant work on and attain dietary goals.    Admit Weight 227 lb 1.2 oz (103 kg)    Expected Outcomes Short Term: Continue to assess and modify interventions until short term weight is achieved;Long Term: Adherence to nutrition and physical activity/exercise program aimed toward attainment of established weight goal;Weight Loss: Understanding of general recommendations for a balanced deficit meal plan, which promotes  1-2 lb weight loss per week and includes a negative energy balance of [phone] kcal/d    Hypertension Yes    Intervention Provide education on lifestyle modifcations including regular physical activity/exercise, weight management, moderate sodium restriction and increased consumption of fresh fruit, vegetables, and low fat dairy, alcohol moderation, and smoking cessation.;Monitor  prescription use compliance.    Expected Outcomes Short Term: Continued assessment and intervention until BP is < 140/22mm HG in hypertensive participants. < 130/78mm HG in hypertensive participants with diabetes, heart failure or chronic kidney disease.;Long Term: Maintenance of blood pressure at goal levels.    Lipids Yes    Intervention Provide education and support for participant on nutrition & aerobic/resistive exercise along with prescribed medications to achieve LDL 70mg , HDL >40mg .    Expected Outcomes Short Term: Participant states understanding of desired cholesterol values and is compliant with medications prescribed. Participant is following exercise prescription and nutrition guidelines.;Long Term: Cholesterol controlled with medications as prescribed, with individualized exercise RX and with personalized nutrition plan. Value goals: LDL < 70mg , HDL > 40 mg.             Core Components/Risk Factors/Patient Goals Review:    Core Components/Risk Factors/Patient Goals at Discharge (Final Review):    ITP Comments:  ITP Comments     Row Name 09/05/22 0800 09/11/22 1327         ITP Comments Medical Director- Dr. Armanda Magic, MD. Introduction to the [facility] / Intensive Cardiac Rehab. Reviewed orientation folder with patient. 30- day ITP review. [place] started cardiac rehab on 09/11/2022 and did well with exercise. Dr. Armanda Magic is medical director.               Comments: See ITP comments

## 2022-09-15 ENCOUNTER — Encounter (HOSPITAL_COMMUNITY)
Admission: RE | Admit: 2022-09-15 | Discharge: 2022-09-15 | Disposition: A | Discharge status: Home / Self Care | Payer: 59 | Source: Ambulatory Visit | Attending: Cardiology | Admitting: Cardiology

## 2022-09-15 DIAGNOSIS — Z955 Presence of coronary angioplasty implant and graft: Secondary | ICD-10-CM | POA: Diagnosis not present

## 2022-09-18 ENCOUNTER — Encounter (HOSPITAL_COMMUNITY)
Admission: RE | Admit: 2022-09-18 | Discharge: 2022-09-18 | Disposition: A | Discharge status: Home / Self Care | Payer: 59 | Source: Ambulatory Visit | Attending: Cardiology | Admitting: Cardiology

## 2022-09-18 DIAGNOSIS — Z955 Presence of coronary angioplasty implant and graft: Secondary | ICD-10-CM | POA: Diagnosis not present

## 2022-09-20 ENCOUNTER — Encounter (HOSPITAL_COMMUNITY)
Admission: RE | Admit: 2022-09-20 | Discharge: 2022-09-20 | Disposition: A | Discharge status: Home / Self Care | Payer: 59 | Source: Ambulatory Visit | Attending: Cardiology | Admitting: Cardiology

## 2022-09-20 DIAGNOSIS — Z955 Presence of coronary angioplasty implant and graft: Secondary | ICD-10-CM

## 2022-09-22 ENCOUNTER — Encounter (HOSPITAL_COMMUNITY)
Admission: RE | Admit: 2022-09-22 | Discharge: 2022-09-22 | Disposition: A | Discharge status: Home / Self Care | Payer: 59 | Source: Ambulatory Visit | Attending: Cardiology | Admitting: Cardiology

## 2022-09-22 DIAGNOSIS — Z955 Presence of coronary angioplasty implant and graft: Secondary | ICD-10-CM

## 2022-09-25 ENCOUNTER — Encounter (HOSPITAL_COMMUNITY): Payer: 59

## 2022-09-27 ENCOUNTER — Encounter (HOSPITAL_COMMUNITY)
Admission: RE | Admit: 2022-09-27 | Discharge: 2022-09-27 | Disposition: A | Discharge status: Home / Self Care | Payer: 59 | Source: Ambulatory Visit | Attending: Cardiology | Admitting: Cardiology

## 2022-09-27 DIAGNOSIS — Z955 Presence of coronary angioplasty implant and graft: Secondary | ICD-10-CM

## 2022-09-27 NOTE — Progress Notes (Signed)
CARDIAC REHAB PHASE 2  Reviewed home exercise with pt today. Pt is tolerating exercise well. Pt will continue to exercise on his own by walking, and using his stationary bike,treadmill and hand weights for 30-45 minutes per session 2-4 days a week in addition to the 3 days in [facility]. Advised pt on THRR, RPE scale, hydration and temperature/humidity precautions. Reinforced NTG use, S/S to stop exercise and when to call MD vs 911. Encouraged warm up cool down and stretches with exercise sessions. Pt verbalized understanding, all questions were answered and pt was given a copy to take home.    Harrie Jeans ACSM-CEP 09/27/2022 8:32 AM

## 2022-09-29 ENCOUNTER — Encounter (HOSPITAL_COMMUNITY): Payer: 59

## 2022-10-02 ENCOUNTER — Encounter (HOSPITAL_COMMUNITY): Payer: 59

## 2022-10-04 ENCOUNTER — Encounter (HOSPITAL_COMMUNITY): Payer: 59

## 2022-10-06 ENCOUNTER — Encounter (HOSPITAL_COMMUNITY): Payer: 59

## 2022-10-09 ENCOUNTER — Encounter (HOSPITAL_COMMUNITY): Payer: 59

## 2022-10-10 ENCOUNTER — Other Ambulatory Visit: Payer: Self-pay

## 2022-10-10 DIAGNOSIS — I2510 Atherosclerotic heart disease of native coronary artery without angina pectoris: Secondary | ICD-10-CM

## 2022-10-10 DIAGNOSIS — Z955 Presence of coronary angioplasty implant and graft: Secondary | ICD-10-CM

## 2022-10-10 NOTE — Progress Notes (Signed)
Cardiac Individual Treatment Plan  Patient Details  Name: Brian Elliott MRN: [id number] Date of Birth: 08-16-1965 Referring Provider:   Flowsheet Row INTENSIVE CARDIAC REHAB ORIENT from 09/05/2022 in [facility]  Referring Provider Tessa Lerner, DO       Initial Encounter Date:  Flowsheet Row INTENSIVE CARDIAC REHAB ORIENT from 09/05/2022 in [facility] for Heart, Vascular, & Lung Health  Date 09/05/22       Visit Diagnosis: 08/04/22 DES mLAD, pLAD  Patient's Home Medications on Admission:  Current Outpatient Medications:    acetaminophen (TYLENOL) 500 MG tablet, Take 500 mg by mouth every 6 (six) hours as needed for moderate pain., Disp: , Rfl:    atorvastatin (LIPITOR) 40 MG tablet, Take 1 tablet (40 mg total) by mouth daily., Disp: 30 tablet, Rfl: 3   [employer] ASPIRIN LOW DOSE 81 MG tablet, Take 81 mg by mouth daily., Disp: , Rfl:    ezetimibe (ZETIA) 10 MG tablet, Take 1 tablet (10 mg total) by mouth daily., Disp: 90 tablet, Rfl: 3   famotidine (PEPCID) 20 MG tablet, Take 20 mg by mouth daily., Disp: , Rfl:    fexofenadine (ALLEGRA) 180 MG tablet, Take 180 mg by mouth daily., Disp: , Rfl:    isosorbide mononitrate (IMDUR) 30 MG 24 hr tablet, Take 1 tablet (30 mg total) by mouth at bedtime., Disp: 90 tablet, Rfl: 3   levothyroxine (SYNTHROID) 150 MCG tablet, Take 150 mcg by mouth daily., Disp: , Rfl:    losartan (COZAAR) 25 MG tablet, Take 0.5 tablets (12.5 mg total) by mouth at bedtime., Disp: 90 tablet, Rfl: 3   MAGNESIUM PO, Take 200 mg by mouth in the morning and at bedtime., Disp: , Rfl:    metoprolol succinate (TOPROL-XL) 25 MG 24 hr tablet, Take 1 tablet (25 mg total) by mouth daily. Take with or immediately following a meal., Disp: 30 tablet, Rfl: 3   Multiple Vitamin (MULTIVITAMIN) tablet, Take 1 tablet by mouth daily., Disp: , Rfl:    nitroGLYCERIN (NITROSTAT) 0.4 MG SL tablet, Place 0.4 mg  under the tongue every 5 (five) minutes as needed for chest pain., Disp: , Rfl:    Omega-3 Fatty Acids (FISH OIL PO), Take 1 capsule by mouth daily., Disp: , Rfl:    Omega-3 Fatty Acids (FISH OIL) 1200 MG CAPS, Take by mouth., Disp: , Rfl:    omeprazole (PRILOSEC) 40 MG capsule, Take 1 capsule (40 mg total) by mouth daily., Disp: 90 capsule, Rfl: 3   sertraline (ZOLOFT) 25 MG tablet, Take 1 tablet (25 mg total) by mouth daily., Disp: 90 tablet, Rfl: 3   ticagrelor (BRILINTA) 90 MG TABS tablet, Take 1 tablet (90 mg total) by mouth 2 (two) times daily., Disp: 60 tablet, Rfl: 3   vitamin E 180 MG (400 UNITS) capsule, Take 400 Units by mouth daily., Disp: , Rfl:    zinc gluconate 50 MG tablet, Take 50 mg by mouth in the morning and at bedtime., Disp: , Rfl:   Past Medical History: Past Medical History:  Diagnosis Date   Allergy    seasonal   Coronary artery disease    GERD (gastroesophageal reflux disease)    Hyperlipidemia    Hypothyroid    Sleep apnea    wears cpap    Tobacco Use: Social History   Tobacco Use  Smoking Status Former   Current packs/day: 0.00   Average packs/day: 2.0 packs/day for 15.0  years (30.0 ttl pk-yrs)   Types: Cigarettes, Cigars   Start date: 03/13/1978   Quit date: 03/13/1993   Years since quitting: 29.5  Smokeless Tobacco Never  Tobacco Comments   occ Cigars    Labs: Review Flowsheet       Latest Ref Rng & Units 08/03/2022 08/04/2022  Labs for ITP Cardiac and Pulmonary Rehab  Cholestrol 0 - 200 mg/dL - 841   LDL (calc) 0 - 99 mg/dL - 90   Direct LDL 0 - 99 mg/dL - 97   HDL-C >66 mg/dL - 36   Trlycerides <063 mg/dL - 016   Hemoglobin W1U 4.8 - 5.6 % 5.9  -    Details            Capillary Blood Glucose: No results found for: "GLUCAP"   Exercise Target Goals: Exercise Program Goal: Individual exercise prescription set using results from initial 6 min walk test and THRR while considering  patient's activity barriers and safety.   Exercise  Prescription Goal: Initial exercise prescription builds to 30-45 minutes a day of aerobic activity, 2-3 days per week.  Home exercise guidelines will be given to patient during program as part of exercise prescription that the participant will acknowledge.  Activity Barriers & Risk Stratification:  Activity Barriers & Cardiac Risk Stratification - 09/05/22 0907       Activity Barriers & Cardiac Risk Stratification   Activity Barriers Other (comment)    Comments Left knee bothers him occassionally.    Cardiac Risk Stratification Low             6 Minute Walk:  6 Minute Walk     Row Name 09/05/22 0917         6 Minute Walk   Phase Initial     Distance 1872 feet     Walk Time 6 minutes     # of Rest Breaks 0     MPH 3.54     METS 4.12     RPE 9     Perceived Dyspnea  0     VO2 Peak 14.43     Symptoms No     Resting HR 81 bpm     Resting BP 108/70     Resting Oxygen Saturation  97 %     Exercise Oxygen Saturation  during 6 min walk 98 %     Max Ex. HR 100 bpm     Max Ex. BP 110/64     2 Minute Post BP 108/72              Oxygen Initial Assessment:   Oxygen Re-Evaluation:   Oxygen Discharge (Final Oxygen Re-Evaluation):   Initial Exercise Prescription:  Initial Exercise Prescription - 09/05/22 1000       Date of Initial Exercise RX and Referring Provider   Date 09/05/22    Referring Provider Tessa Lerner, DO    Expected Discharge Date 11/17/22      Bike   Level 2    Watts 50    METs 3.8      NuStep   Level 3    SPM 85    Minutes 15    METs 3.2      Prescription Details   Frequency (times per week) 3    Duration Progress to 30 minutes of continuous aerobic without signs/symptoms of physical distress      Intensity   THRR 40-80% of Max Heartrate 65-130    Ratings of Perceived Exertion  11-13    Perceived Dyspnea 0-4      Progression   Progression Continue to progress workloads to maintain intensity without signs/symptoms of physical  distress.      Resistance Training   Training Prescription Yes    Weight 5 lbs    Reps 10-15             Perform Capillary Blood Glucose checks as needed.  Exercise Prescription Changes:   Exercise Prescription Changes     Row Name 09/11/22 0800 09/27/22 0825           Response to Exercise   Blood Pressure (Admit) 112/72 110/76      Blood Pressure (Exercise) 130/80 124/80      Blood Pressure (Exit) 118/70 100/70      Heart Rate (Admit) 76 bpm 62 bpm      Heart Rate (Exercise) 100 bpm 102 bpm      Heart Rate (Exit) 75 bpm 71 bpm      Rating of Perceived Exertion (Exercise) 8 11.5      Perceived Dyspnea (Exercise) 0 0      Symptoms 0 0      Comments Pt first day in teh [facility] ICR program REVD MET's, goals and home ExRx      Duration Progress to 30 minutes of  aerobic without signs/symptoms of physical distress Progress to 30 minutes of  aerobic without signs/symptoms of physical distress      Intensity THRR unchanged THRR unchanged        Progression   Progression Continue to progress workloads to maintain intensity without signs/symptoms of physical distress. Continue to progress workloads to maintain intensity without signs/symptoms of physical distress.      Average METs 2.5 3.35        Resistance Training   Training Prescription Yes No      Weight 5 --      Reps 10-15 --      Time 10 Minutes --        Bike   Level 2 3      Watts 29 45      Minutes 15 15      METs 2.7 3.4        NuStep   Level 3 4      SPM -- 114      Minutes 15 15      METs 2.3 3.3        Home Exercise Plan   Plans to continue exercise at -- Home (comment)      Frequency -- Add 4 additional days to program exercise sessions.      Initial Home Exercises Provided -- 09/27/22               Exercise Comments:   Exercise Comments     Row Name 09/11/22 0848 09/27/22 0832         Exercise Comments Pt first day in the CRP2 program. Pt tolerated exercise well with an average  MET level of 2.5. Pt is off to a good start and is learning his THRR, RPE and ExRx. Will continue to monitor pt and progress workloads as tolerated without sign or symptom REVD MET's, goals and home ExRx. Pt tolerated exercise well with an average MET level of 3.35. Pt is doing well and progressing his MET's, he INC WL on both stations today. Pt feels good about his goals of getting into a regular Ex routine and eating a more heart healthy diet.  His family is very supportive. He will continue to exercise bywalking, using his treadmill, stationary bike and handweights 2-4 days for 30-45 mins               Exercise Goals and Review:   Exercise Goals     Row Name 09/05/22 0855             Exercise Goals   Increase Physical Activity Yes       Intervention Provide advice, education, support and counseling about physical activity/exercise needs.;Develop an individualized exercise prescription for aerobic and resistive training based on initial evaluation findings, risk stratification, comorbidities and participant's personal goals.       Expected Outcomes Short Term: Attend rehab on a regular basis to increase amount of physical activity.;Long Term: Exercising regularly at least 3-5 days a week.;Long Term: Add in home exercise to make exercise part of routine and to increase amount of physical activity.       Increase Strength and Stamina Yes       Intervention Provide advice, education, support and counseling about physical activity/exercise needs.;Develop an individualized exercise prescription for aerobic and resistive training based on initial evaluation findings, risk stratification, comorbidities and participant's personal goals.       Expected Outcomes Short Term: Increase workloads from initial exercise prescription for resistance, speed, and METs.;Short Term: Perform resistance training exercises routinely during rehab and add in resistance training at home;Long Term: Improve  cardiorespiratory fitness, muscular endurance and strength as measured by increased METs and functional capacity ( )       Able to understand and use rate of perceived exertion (RPE) scale Yes       Intervention Provide education and explanation on how to use RPE scale       Expected Outcomes Short Term: Able to use RPE daily in rehab to express subjective intensity level;Long Term:  Able to use RPE to guide intensity level when exercising independently       Knowledge and understanding of Target Heart Rate Range (THRR) Yes       Intervention Provide education and explanation of THRR including how the numbers were predicted and where they are located for reference       Expected Outcomes Short Term: Able to state/look up THRR;Short Term: Able to use daily as guideline for intensity in rehab;Long Term: Able to use THRR to govern intensity when exercising independently       Able to check pulse independently Yes       Intervention Provide education and demonstration on how to check pulse in carotid and radial arteries.;Review the importance of being able to check your own pulse for safety during independent exercise       Expected Outcomes Short Term: Able to explain why pulse checking is important during independent exercise;Long Term: Able to check pulse independently and accurately       Understanding of Exercise Prescription Yes       Intervention Provide education, explanation, and written materials on patient's individual exercise prescription       Expected Outcomes Short Term: Able to explain program exercise prescription;Long Term: Able to explain home exercise prescription to exercise independently                Exercise Goals Re-Evaluation :  Exercise Goals Re-Evaluation     Row Name 09/11/22 0847 09/27/22 0827           Exercise Goal Re-Evaluation   Exercise Goals Review Increase Physical Activity;Understanding of Exercise  Prescription;Increase Strength and  Stamina;Knowledge and understanding of Target Heart Rate Range (THRR);Able to understand and use rate of perceived exertion (RPE) scale Increase Physical Activity;Understanding of Exercise Prescription;Increase Strength and Stamina;Knowledge and understanding of Target Heart Rate Range (THRR);Able to understand and use rate of perceived exertion (RPE) scale      Comments Pt first day in the CRP2 program. Pt tolerated exercise well with an average MET level of 2.5. Pt is off to a good start and is learning his THRR, RPE and ExRx. REVD MET's, goals and home ExRx. Pt tolerated exercise well with an average MET level of 3.35. Pt is doing well and progressing his MET's, he INC WL on both stations today. Pt feels good about his goals of getting into a regular Ex routine and eating a more heart healthy diet. His family is very supportive. He will continue to exercise bywalking, using his treadmill, stationary bike and handweights 2-4 days for 30-45 mins      Expected Outcomes Will continue to monitor pt and progress workloads as tolerated without sign or symptom Will continue to monitor pt and progress workloads as tolerated without sign or symptom               Discharge Exercise Prescription (Final Exercise Prescription Changes):  Exercise Prescription Changes - 09/27/22 0825       Response to Exercise   Blood Pressure (Admit) 110/76    Blood Pressure (Exercise) 124/80    Blood Pressure (Exit) 100/70    Heart Rate (Admit) 62 bpm    Heart Rate (Exercise) 102 bpm    Heart Rate (Exit) 71 bpm    Rating of Perceived Exertion (Exercise) 11.5    Perceived Dyspnea (Exercise) 0    Symptoms 0    Comments REVD MET's, goals and home ExRx    Duration Progress to 30 minutes of  aerobic without signs/symptoms of physical distress    Intensity THRR unchanged      Progression   Progression Continue to progress workloads to maintain intensity without signs/symptoms of physical distress.    Average METs 3.35       Resistance Training   Training Prescription No      Bike   Level 3    Watts 45    Minutes 15    METs 3.4      NuStep   Level 4    SPM 114    Minutes 15    METs 3.3      Home Exercise Plan   Plans to continue exercise at Home (comment)    Frequency Add 4 additional days to program exercise sessions.    Initial Home Exercises Provided 09/27/22             Nutrition:  Target Goals: Understanding of nutrition guidelines, daily intake of sodium 1500mg , cholesterol 200mg , calories 30% from fat and 7% or less from saturated fats, daily to have 5 or more servings of fruits and vegetables.  Biometrics:  Pre Biometrics - 09/05/22 0800       Pre Biometrics   Waist Circumference 45.75 inches    Hip Circumference 42 inches    Waist to Hip Ratio 1.09 %    Triceps Skinfold 15 mm    % Body Fat 31.5 %    Grip Strength 58 kg    Flexibility 0 in    Single Leg Stand 28.68 seconds              Nutrition Therapy Plan and  Nutrition Goals:  Nutrition Therapy & Goals - 09/11/22 1049       Nutrition Therapy   Diet Heart Healthy Diet    Drug/Food Interactions Statins/Certain Fruits      Personal Nutrition Goals   Nutrition Goal Patient to identify strategies for reducing cardiovascular risk by attending the [facility] education and nutrition series weekly.    Personal Goal #2 Patient to improve diet quality by using the plate method as a guide for meal planning to include lean protein/plant protein, fruits, vegetables, whole grains, nonfat dairy as part of a well-balanced diet.    Personal Goal #3 Patient to limit sodium to 1500mg  per day    Comments Patient will benefit from participation in intensive cardiac rehab for nutrition, exercise, and lifestyle modification.      Intervention Plan   Intervention Prescribe, educate and counsel regarding individualized specific dietary modifications aiming towards targeted core components such as weight, hypertension, lipid  management, diabetes, heart failure and other comorbidities.;Nutrition handout(s) given to patient.    Expected Outcomes Short Term Goal: Understand basic principles of dietary content, such as calories, fat, sodium, cholesterol and nutrients.;Long Term Goal: Adherence to prescribed nutrition plan.             Nutrition Assessments:  Nutrition Assessments - 09/15/22 0821       Rate Your Plate Scores   Pre Score 52            MEDIFICTS Score Key: ?70 Need to make dietary changes  40-70 Heart Healthy Diet ? 40 Therapeutic Level Cholesterol Diet   Flowsheet Row INTENSIVE CARDIAC REHAB from 09/13/2022 in [facility], V[facility], & Lung [facility]  Picture Your Plate Total Score on Admission 52      Picture Your Plate Scores: <13 Unhealthy dietary pattern with much room for improvement. 41-50 Dietary pattern unlikely to meet recommendations for good health and room for improvement. 51-60 More healthful dietary pattern, with some room for improvement.  >60 Healthy dietary pattern, although there may be some specific behaviors that could be improved.    Nutrition Goals Re-Evaluation:  Nutrition Goals Re-Evaluation     Row Name 09/11/22 1049             Goals   Current Weight 227 lb 1.2 oz (103 kg)       Comment lipids WNL-LDL 90, HDL 36, A1c 5.9       Expected Outcome Patient will benefit from participation in intensive cardiac rehab for nutrition, exercise, and lifestyle modification.                Nutrition Goals Re-Evaluation:  Nutrition Goals Re-Evaluation     Row Name 09/11/22 1049             Goals   Current Weight 227 lb 1.2 oz (103 kg)       Comment lipids WNL-LDL 90, HDL 36, A1c 5.9       Expected Outcome Patient will benefit from participation in intensive cardiac rehab for nutrition, exercise, and lifestyle modification.                Nutrition Goals Discharge (Final Nutrition Goals Re-Evaluation):   Nutrition Goals Re-Evaluation - 09/11/22 1049       Goals   Current Weight 227 lb 1.2 oz (103 kg)    Comment lipids WNL-LDL 90, HDL 36, A1c 5.9    Expected Outcome Patient will benefit from participation in intensive cardiac rehab for nutrition, exercise, and  lifestyle modification.             Psychosocial: Target Goals: Acknowledge presence or absence of significant depression and/or stress, maximize coping skills, provide positive support system. Participant is able to verbalize types and ability to use techniques and skills needed for reducing stress and depression.  Initial Review & Psychosocial Screening:  Initial Psych Review & Screening - 09/05/22 0927       Initial Review   Current issues with None Identified      Family Dynamics   Good Support System? Yes    Comments Pt stated he has a good support system, and he has a positive attitude toward cardiac rehab.      Barriers   Psychosocial barriers to participate in program There are no identifiable barriers or psychosocial needs.      Screening Interventions   Interventions Encouraged to exercise;Provide feedback about the scores to participant    Expected Outcomes Long Term Goal: Stressors or current issues are controlled or eliminated.;Long Term goal: The participant improves quality of Life and PHQ9 Scores as seen by post scores and/or verbalization of changes             Quality of Life Scores:  Quality of Life - 09/05/22 1004       Quality of Life   Select Quality of Life      Quality of Life Scores   Health/Function Pre 28.67 %    Socioeconomic Pre 30 %    Psych/Spiritual Pre 29.14 %    Family Pre 30 %    GLOBAL Pre 29.24 %            Scores of 19 and below usually indicate a poorer quality of life in these areas.  A difference of  2-3 points is a clinically meaningful difference.  A difference of 2-3 points in the total score of the Quality of Life Index has been associated with significant  improvement in overall quality of life, self-image, physical symptoms, and general health in studies assessing change in quality of life.  PHQ-9: Review Flowsheet       09/05/2022  Depression screen PHQ 2/9  Decreased Interest 0  Down, Depressed, Hopeless 0  PHQ - 2 Score 0  Altered sleeping 0  Tired, decreased energy 0  Change in appetite 0  Feeling bad or failure about yourself  0  Trouble concentrating 0  Moving slowly or fidgety/restless 0  Suicidal thoughts 0  PHQ-9 Score 0    Details           Interpretation of Total Score  Total Score Depression Severity:  1-4 = Minimal depression, 5-9 = Mild depression, 10-14 = Moderate depression, 15-19 = Moderately severe depression, 20-27 = Severe depression   Psychosocial Evaluation and Intervention:  Psychosocial Evaluation - 09/11/22 1322       Psychosocial Evaluation & Interventions   Interventions Encouraged to exercise with the program and follow exercise prescription    Continue Psychosocial Services  No Follow up required             Psychosocial Re-Evaluation:  Psychosocial Re-Evaluation     Row Name 10/10/22 0845             Psychosocial Re-Evaluation   Current issues with None Identified       Interventions Encouraged to attend Cardiac Rehabilitation for the exercise       Continue Psychosocial Services  No Follow up required  Psychosocial Discharge (Final Psychosocial Re-Evaluation):  Psychosocial Re-Evaluation - 10/10/22 0845       Psychosocial Re-Evaluation   Current issues with None Identified    Interventions Encouraged to attend Cardiac Rehabilitation for the exercise    Continue Psychosocial Services  No Follow up required             Vocational Rehabilitation: Provide vocational rehab assistance to qualifying candidates.   Vocational Rehab Evaluation & Intervention:  Vocational Rehab - 09/05/22 0953       Initial Vocational Rehab Evaluation & Intervention    Assessment shows need for Vocational Rehabilitation No             Education: Education Goals: Education classes will be provided on a weekly basis, covering required topics. Participant will state understanding/return demonstration of topics presented.    Education     Row Name 09/11/22 0900     Education   Cardiac Education Topics [facility]in   Select Core Videos     Core Videos   Educator Dietitian   Select Nutrition   Nutrition Calorie Density   Instruction Review Code 1- Verbalizes Understanding   Class Start Time 0815   Class Stop Time 0857   Class Time Calculation (min) 42 min    Row Name 09/13/22 1000     Education   Cardiac Education Topics [facility]rit[facility]in   Secondary school teacher School   Educator Dietitian   Weekly Topic Efficiency Cooking - Meals in a Snap   Instruction Review Code 1- Verbalizes Understanding   Class Start Time 0815   Class Stop Time 0850   Class Time Calculation (min) 35 min    Row Name 09/15/22 0800     Education   Cardiac Education Topics Pritikin   Psychologist, forensic Exercise Education   Exercise Education Move It!   Instruction Review Code 1- Verbalizes Understanding   Class Start Time [phone]   Class Stop Time 0844   Class Time Calculation (min) 33 min    Row Name 09/18/22 0900     Education   Cardiac Education Topics Pritikin   Select Workshops     Workshops   Educator Exercise Physiologist   Select Psychosocial   Psychosocial Workshop Focused Goals, Sustainable Changes   Instruction Review Code 1- Verbalizes Understanding   Class Start Time [phone]   Class Stop Time 0845   Class Time Calculation (min) 35 min    Row Name 09/20/22 0800     Education   Cardiac Education Topics Pritikin   Secondary school teacher School   Educator Dietitian   Weekly Topic One-Pot Wonders   Instruction Review Code 1- Verbalizes Understanding   Class Start  Time [phone]   Class Stop Time [phone]   Class Time Calculation (min) 32 min    Row Name 09/22/22 0800     Education   Cardiac Education Topics Pritikin   Select Core Videos     Core Videos   Educator Exercise Physiologist   Select General Education   General Education Hypertension and Heart Disease   Instruction Review Code 1- Verbalizes Understanding   Class Start Time 0815   Class Stop Time 0850   Class Time Calculation (min) 35 min    Row Name 09/27/22 1000     Education   Cardiac Education Topics [facility]rit[facility]in   [employer]  Secondary school teacher   Weekly Topic Comforting Weekend Breakfasts   Instruction Review Code 1- Verbalizes Understanding   Class Start Time 0815   Class Stop Time [phone]   Class Time Calculation (min) 31 min            Core Videos: Exercise    Move It!  Clinical staff conducted group or individual video education with verbal and written material and guidebook.  Patient learns the recommended Pritikin exercise program. Exercise with the goal of living a long, healthy life. Some of the health benefits of exercise include controlled diabetes, healthier blood pressure levels, improved cholesterol levels, improved heart and lung capacity, improved sleep, and better body composition. Everyone should speak with their doctor before starting or changing an exercise routine.  Biomechanical Limitations Clinical staff conducted group or individual video education with verbal and written material and guidebook.  Patient learns how biomechanical limitations can impact exercise and how we can mitigate and possibly overcome limitations to have an impactful and balanced exercise routine.  Body Composition Clinical staff conducted group or individual video education with verbal and written material and guidebook.  Patient learns that body composition (ratio of muscle mass to fat mass) is a key component to assessing overall fitness, rather than  body weight alone. Increased fat mass, especially visceral belly fat, can put [place] at increased risk for metabolic syndrome, type 2 diabetes, heart disease, and even death. It is recommended to combine diet and exercise (cardiovascular and resistance training) to improve your body composition. Seek guidance from your physician and exercise physiologist before implementing an exercise routine.  Exercise Action Plan Clinical staff conducted group or individual video education with verbal and written material and guidebook.  Patient learns the recommended strategies to achieve and enjoy long-term exercise adherence, including variety, self-motivation, self-efficacy, and positive decision making. Benefits of exercise include fitness, good health, weight management, more energy, better sleep, less stress, and overall well-being.  Medical   Heart Disease Risk Reduction Clinical staff conducted group or individual video education with verbal and written material and guidebook.  Patient learns our heart is our most vital organ as it circulates oxygen, nutrients, white blood cells, and hormones throughout the entire body, and carries waste away. Data supports a plant-based eating plan like the Pritikin Program for its effectiveness in slowing progression of and reversing heart disease. The video provides a number of recommendations to address heart disease.   Metabolic Syndrome and Belly Fat  Clinical staff conducted group or individual video education with verbal and written material and guidebook.  Patient learns what metabolic syndrome is, how it leads to heart disease, and how one can reverse it and keep it from coming back. You have metabolic syndrome if you have 3 of the following 5 criteria: abdominal obesity, high blood pressure, high triglycerides, low HDL cholesterol, and high blood sugar.  Hypertension and Heart Disease Clinical staff conducted group or individual video education with verbal and  written material and guidebook.  Patient learns that high blood pressure, or hypertension, is very common in the [place]. Hypertension is largely due to excessive salt intake, but other important risk factors include being overweight, physical inactivity, drinking too much alcohol, smoking, and not eating enough potassium from fruits and vegetables. High blood pressure is a leading risk factor for heart attack, stroke, congestive heart failure, dementia, kidney failure, and premature death. Long-term effects of excessive salt intake include stiffening of the arteries and thickening of heart muscle  and organ damage. Recommendations include ways to reduce hypertension and the risk of heart disease.  Diseases of Our Time - Focusing on Diabetes Clinical staff conducted group or individual video education with verbal and written material and guidebook.  Patient learns why the best way to stop diseases of our time is prevention, through food and other lifestyle changes. Medicine (such as prescription pills and surgeries) is often only a Band-Aid on the problem, not a long-term solution. Most common diseases of our time include obesity, type 2 diabetes, hypertension, heart disease, and cancer. The Pritikin Program is recommended and has been proven to help reduce, reverse, and/or prevent the damaging effects of metabolic syndrome.  Nutrition   Overview of the Pritikin Eating Plan  Clinical staff conducted group or individual video education with verbal and written material and guidebook.  Patient learns about the Pritikin Eating Plan for disease risk reduction. The Pritikin Eating Plan emphasizes a wide variety of unrefined, minimally-processed carbohydrates, like fruits, vegetables, whole grains, and legumes. Go, Caution, and Stop food choices are explained. Plant-based and lean animal proteins are emphasized. Rationale provided for low sodium intake for blood pressure control, low added sugars for blood  sugar stabilization, and low added fats and oils for coronary artery disease risk reduction and weight management.  Calorie Density  Clinical staff conducted group or individual video education with verbal and written material and guidebook.  Patient learns about calorie density and how it impacts the Pritikin Eating Plan. Knowing the characteristics of the food you choose will help you decide whether those foods will lead to weight gain or weight loss, and whether you want to consume more or less of them. Weight loss is usually a side effect of the Pritikin Eating Plan because of its focus on low calorie-dense foods.  Label Reading  Clinical staff conducted group or individual video education with verbal and written material and guidebook.  Patient learns about the Pritikin recommended label reading guidelines and corresponding recommendations regarding calorie density, added sugars, sodium content, and whole grains.  Dining Out - Part 1  Clinical staff conducted group or individual video education with verbal and written material and guidebook.  Patient learns that restaurant meals can be sabotaging because they can be so high in calories, fat, sodium, and/or sugar. Patient learns recommended strategies on how to positively address this and avoid unhealthy pitfalls.  Facts on Fats  Clinical staff conducted group or individual video education with verbal and written material and guidebook.  Patient learns that lifestyle modifications can be just as effective, if not more so, as many medications for lowering your risk of heart disease. A Pritikin lifestyle can help to reduce your risk of inflammation and atherosclerosis (cholesterol build-up, or plaque, in the artery walls). Lifestyle interventions such as dietary choices and physical activity address the cause of atherosclerosis. A review of the types of fats and their impact on blood cholesterol levels, along with dietary recommendations to reduce  fat intake is also included.  Nutrition Action Plan  Clinical staff conducted group or individual video education with verbal and written material and guidebook.  Patient learns how to incorporate Pritikin recommendations into their lifestyle. Recommendations include planning and keeping personal health goals in mind as an important part of their success.  Healthy Mind-Set    Healthy Minds, Bodies, Hearts  Clinical staff conducted group or individual video education with verbal and written material and guidebook.  Patient learns how to identify when they are stressed. Video will discuss  the impact of that stress, as well as the many benefits of stress management. Patient will also be introduced to stress management techniques. The way we think, act, and feel has an impact on our hearts.  How Our Thoughts Can Heal Our Hearts  Clinical staff conducted group or individual video education with verbal and written material and guidebook.  Patient learns that negative thoughts can cause depression and anxiety. This can result in negative lifestyle behavior and serious health problems. Cognitive behavioral therapy is an effective method to help control our thoughts in order to change and improve our emotional outlook.  Additional Videos:  Exercise    Improving Performance  Clinical staff conducted group or individual video education with verbal and written material and guidebook.  Patient learns to use a non-linear approach by alternating intensity levels and lengths of time spent exercising to help burn more calories and lose more body fat. Cardiovascular exercise helps improve heart health, metabolism, hormonal balance, blood sugar control, and recovery from fatigue. Resistance training improves strength, endurance, balance, coordination, reaction time, metabolism, and muscle mass. Flexibility exercise improves circulation, posture, and balance. Seek guidance from your physician and exercise  physiologist before implementing an exercise routine and learn your capabilities and proper form for all exercise.  Introduction to Yoga  Clinical staff conducted group or individual video education with verbal and written material and guidebook.  Patient learns about yoga, a discipline of the coming together of mind, breath, and body. The benefits of yoga include improved flexibility, improved range of motion, better posture and core strength, increased lung function, weight loss, and positive self-image. Yoga's heart health benefits include lowered blood pressure, healthier heart rate, decreased cholesterol and triglyceride levels, improved immune function, and reduced stress. Seek guidance from your physician and exercise physiologist before implementing an exercise routine and learn your capabilities and proper form for all exercise.  Medical   Aging: Enhancing Your Quality of Life  Clinical staff conducted group or individual video education with verbal and written material and guidebook.  Patient learns key strategies and recommendations to stay in good physical health and enhance quality of life, such as prevention strategies, having an advocate, securing a Health Care Proxy and Power of Attorney, and keeping a list of medications and system for tracking them. It also discusses how to avoid risk for bone loss.  Biology of Weight Control  Clinical staff conducted group or individual video education with verbal and written material and guidebook.  Patient learns that weight gain occurs because we consume more calories than we burn (eating more, moving less). Even if your body weight is normal, you may have higher ratios of fat compared to muscle mass. Too much body fat puts you at increased risk for cardiovascular disease, heart attack, stroke, type 2 diabetes, and obesity-related cancers. In addition to exercise, following the Pritikin Eating Plan can help reduce your risk.  Decoding Lab Results   Clinical staff conducted group or individual video education with verbal and written material and guidebook.  Patient learns that lab test reflects one measurement whose values change over time and are influenced by many factors, including medication, stress, sleep, exercise, food, hydration, pre-existing medical conditions, and more. It is recommended to use the knowledge from this video to become more involved with your lab results and evaluate your numbers to speak with your doctor.   Diseases of Our Time - Overview  Clinical staff conducted group or individual video education with verbal and written material and guidebook.  Patient learns that according to the CDC, 50% to 70% of chronic diseases (such as obesity, type 2 diabetes, elevated lipids, hypertension, and heart disease) are avoidable through lifestyle improvements including healthier food choices, listening to satiety cues, and increased physical activity.  Sleep Disorders Clinical staff conducted group or individual video education with verbal and written material and guidebook.  Patient learns how good quality and duration of sleep are important to overall health and well-being. Patient also learns about sleep disorders and how they impact health along with recommendations to address them, including discussing with a physician.  Nutrition  Dining Out - Part 2 Clinical staff conducted group or individual video education with verbal and written material and guidebook.  Patient learns how to plan ahead and communicate in order to maximize their dining experience in a healthy and nutritious manner. Included are recommended food choices based on the type of restaurant the patient is visiting.   Fueling a Banker conducted group or individual video education with verbal and written material and guidebook.  There is a strong connection between our food choices and our health. Diseases like obesity and type 2 diabetes  are very prevalent and are in large-part due to lifestyle choices. The Pritikin Eating Plan provides plenty of food and hunger-curbing satisfaction. It is easy to follow, affordable, and helps reduce health risks.  Menu Workshop  Clinical staff conducted group or individual video education with verbal and written material and guidebook.  Patient learns that restaurant meals can sabotage health goals because they are often packed with calories, fat, sodium, and sugar. Recommendations include strategies to plan ahead and to communicate with the manager, chef, or server to help order a healthier meal.  Planning Your Eating Strategy  Clinical staff conducted group or individual video education with verbal and written material and guidebook.  Patient learns about the Pritikin Eating Plan and its benefit of reducing the risk of disease. The Pritikin Eating Plan does not focus on calories. Instead, it emphasizes high-quality, nutrient-rich foods. By knowing the characteristics of the foods, we choose, we can determine their calorie density and make informed decisions.  Targeting Your Nutrition Priorities  Clinical staff conducted group or individual video education with verbal and written material and guidebook.  Patient learns that lifestyle habits have a tremendous impact on disease risk and progression. This video provides eating and physical activity recommendations based on your personal health goals, such as reducing LDL cholesterol, losing weight, preventing or controlling type 2 diabetes, and reducing high blood pressure.  Vitamins and Minerals  Clinical staff conducted group or individual video education with verbal and written material and guidebook.  Patient learns different ways to obtain key vitamins and minerals, including through a recommended healthy diet. It is important to discuss all supplements you take with your doctor.   Healthy Mind-Set    Smoking Cessation  Clinical staff  conducted group or individual video education with verbal and written material and guidebook.  Patient learns that cigarette smoking and tobacco addiction pose a serious health risk which affects millions of people. Stopping smoking will significantly reduce the risk of heart disease, lung disease, and many forms of cancer. Recommended strategies for quitting are covered, including working with your doctor to develop a successful plan.  Culinary   Becoming a Set designer conducted group or individual video education with verbal and written material and guidebook.  Patient learns that cooking at home can be healthy, cost-effective, quick, and  puts them in control. Keys to cooking healthy recipes will include looking at your recipe, assessing your equipment needs, planning ahead, making it simple, choosing cost-effective seasonal ingredients, and limiting the use of added fats, salts, and sugars.  Cooking - Breakfast and Snacks  Clinical staff conducted group or individual video education with verbal and written material and guidebook.  Patient learns how important breakfast is to satiety and nutrition through the entire day. Recommendations include key foods to eat during breakfast to help stabilize blood sugar levels and to prevent overeating at meals later in the day. Planning ahead is also a key component.  Cooking - Educational psychologist conducted group or individual video education with verbal and written material and guidebook.  Patient learns eating strategies to improve overall health, including an approach to cook more at home. Recommendations include thinking of animal protein as a side on your plate rather than center stage and focusing instead on lower calorie dense options like vegetables, fruits, whole grains, and plant-based proteins, such as beans. Making sauces in large quantities to freeze for later and leaving the skin on your vegetables are also  recommended to maximize your experience.  Cooking - Healthy Salads and Dressing Clinical staff conducted group or individual video education with verbal and written material and guidebook.  Patient learns that vegetables, fruits, whole grains, and legumes are the foundations of the Pritikin Eating Plan. Recommendations include how to incorporate each of these in flavorful and healthy salads, and how to create homemade salad dressings. Proper handling of ingredients is also covered. Cooking - Soups and [employer] - Soups and Desserts Clinical staff conducted group or individual video education with verbal and written material and guidebook.  Patient learns that Pritikin soups and desserts make for easy, nutritious, and delicious snacks and meal components that are low in sodium, fat, sugar, and calorie density, while high in vitamins, minerals, and filling fiber. Recommendations include simple and healthy ideas for soups and desserts.   Overview     The Pritikin Solution Program Overview Clinical staff conducted group or individual video education with verbal and written material and guidebook.  Patient learns that the results of the Pritikin Program have been documented in more than 100 articles published in peer-reviewed journals, and the benefits include reducing risk factors for (and, in some cases, even reversing) high cholesterol, high blood pressure, type 2 diabetes, obesity, and more! An overview of the three key pillars of the Pritikin Program will be covered: eating well, doing regular exercise, and having a healthy mind-set.  WORKSHOPS  Exercise: Exercise Basics: Building Your Action Plan Clinical staff led group instruction and group discussion with PowerPoint presentation and patient guidebook. To enhance the learning environment the use of posters, models and videos may be added. At the conclusion of this workshop, patients will comprehend the difference between physical  activity and exercise, as well as the benefits of incorporating both, into their routine. Patients will understand the FITT (Frequency, Intensity, Time, and Type) principle and how to use it to build an exercise action plan. In addition, safety concerns and other considerations for exercise and cardiac rehab will be addressed by the presenter. The purpose of this lesson is to promote a comprehensive and effective weekly exercise routine in order to improve patients' overall level of fitness.   Managing Heart Disease: Your Path to a Healthier Heart Clinical staff led group instruction and group discussion with PowerPoint presentation and patient guidebook. To enhance the  learning environment the use of posters, models and videos may be added.At the conclusion of this workshop, patients will understand the anatomy and physiology of the heart. Additionally, they will understand how Pritikin's three pillars impact the risk factors, the progression, and the management of heart disease.  The purpose of this lesson is to provide a high-level overview of the heart, heart disease, and how the Pritikin lifestyle positively impacts risk factors.  Exercise Biomechanics Clinical staff led group instruction and group discussion with PowerPoint presentation and patient guidebook. To enhance the learning environment the use of posters, models and videos may be added. Patients will learn how the structural parts of their bodies function and how these functions impact their daily activities, movement, and exercise. Patients will learn how to promote a neutral spine, learn how to manage pain, and identify ways to improve their physical movement in order to promote healthy living. The purpose of this lesson is to expose patients to common physical limitations that impact physical activity. Participants will learn practical ways to adapt and manage aches and pains, and to minimize their effect on regular exercise.  Patients will learn how to maintain good posture while sitting, walking, and lifting.  Balance Training and Fall Prevention  Clinical staff led group instruction and group discussion with PowerPoint presentation and patient guidebook. To enhance the learning environment the use of posters, models and videos may be added. At the conclusion of this workshop, patients will understand the importance of their sensorimotor skills (vision, proprioception, and the vestibular system) in maintaining their ability to balance as they age. Patients will apply a variety of balancing exercises that are appropriate for their current level of function. Patients will understand the common causes for poor balance, possible solutions to these problems, and ways to modify their physical environment in order to minimize their fall risk. The purpose of this lesson is to teach patients about the importance of maintaining balance as they age and ways to minimize their risk of falling.  WORKSHOPS   Nutrition:  Fueling a Ship broker led group instruction and group discussion with PowerPoint presentation and patient guidebook. To enhance the learning environment the use of posters, models and videos may be added. Patients will review the foundational principles of the Pritikin Eating Plan and understand what constitutes a serving size in each of the food groups. Patients will also learn Pritikin-friendly foods that are better choices when away from home and review make-ahead meal and snack options. Calorie density will be reviewed and applied to three nutrition priorities: weight maintenance, weight loss, and weight gain. The purpose of this lesson is to reinforce (in a group setting) the key concepts around what patients are recommended to eat and how to apply these guidelines when away from home by planning and selecting Pritikin-friendly options. Patients will understand how calorie density may be adjusted for  different weight management goals.  Mindful Eating  Clinical staff led group instruction and group discussion with PowerPoint presentation and patient guidebook. To enhance the learning environment the use of posters, models and videos may be added. Patients will briefly review the concepts of the Pritikin Eating Plan and the importance of low-calorie dense foods. The concept of mindful eating will be introduced as well as the importance of paying attention to internal hunger signals. Triggers for non-hunger eating and techniques for dealing with triggers will be explored. The purpose of this lesson is to provide patients with the opportunity to review the basic principles of  the Pritikin Eating Plan, discuss the value of eating mindfully and how to measure internal cues of hunger and fullness using the Hunger Scale. Patients will also discuss reasons for non-hunger eating and learn strategies to use for controlling emotional eating.  Targeting Your Nutrition Priorities Clinical staff led group instruction and group discussion with PowerPoint presentation and patient guidebook. To enhance the learning environment the use of posters, models and videos may be added. Patients will learn how to determine their genetic susceptibility to disease by reviewing their family history. Patients will gain insight into the importance of diet as part of an overall healthy lifestyle in mitigating the impact of genetics and other environmental insults. The purpose of this lesson is to provide patients with the opportunity to assess their personal nutrition priorities by looking at their family history, their own health history and current risk factors. Patients will also be able to discuss ways of prioritizing and modifying the Pritikin Eating Plan for their highest risk areas  Menu  Clinical staff led group instruction and group discussion with PowerPoint presentation and patient guidebook. To enhance the learning  environment the use of posters, models and videos may be added. Using menus brought in from [employer], or printed from [employer], patients will apply the Pritikin dining out guidelines that were presented in the [employer] video. Patients will also be able to practice these guidelines in a variety of provided scenarios. The purpose of this lesson is to provide patients with the opportunity to practice hands-on learning of the Pritikin Dining Out guidelines with actual menus and practice scenarios.  Label Reading Clinical staff led group instruction and group discussion with PowerPoint presentation and patient guidebook. To enhance the learning environment the use of posters, models and videos may be added. Patients will review and discuss the Pritikin label reading guidelines presented in Pritikin's Label Reading Educational series video. Using fool labels brought in from local grocery stores and markets, patients will apply the label reading guidelines and determine if the packaged food meet the Pritikin guidelines. The purpose of this lesson is to provide patients with the opportunity to review, discuss, and practice hands-on learning of the Pritikin Label Reading guidelines with actual packaged food labels. Cooking School  Pritikin's [employer] are designed to teach patients ways to prepare quick, simple, and affordable recipes at home. The importance of nutrition's role in chronic disease risk reduction is reflected in its emphasis in the overall Pritikin program. By learning how to prepare essential core Pritikin Eating Plan recipes, patients will increase control over what they eat; be able to customize the flavor of foods without the use of added salt, sugar, or fat; and improve the quality of the food they consume. By learning a set of core recipes which are easily assembled, quickly prepared, and affordable, patients are more likely to prepare more healthy  foods at home. These workshops focus on convenient breakfasts, simple entres, side dishes, and desserts which can be prepared with minimal effort and are consistent with nutrition recommendations for cardiovascular risk reduction. Cooking [employer] are taught by a Armed forces logistics/support/administrative officer (RD) who has been trained by the [employer]. The chef or RD has a clear understanding of the importance of minimizing - if not completely eliminating - added fat, sugar, and sodium in recipes. Throughout the series of Cooking School Workshop sessions, patients will learn about healthy ingredients and efficient methods of cooking to build confidence in their capability  to prepare    Cooking School weekly topics:  Adding Flavor- Sodium-Free  Fast and Healthy Breakfasts  Powerhouse Plant-Based Proteins  Satisfying Salads and Dressings  Simple Sides and Sauces  International Cuisine-Spotlight on the Blue Zones  Delicious Desserts  Savory Soups  Efficiency Cooking - Meals in a Snap  Tasty Appetizers and Snacks  Comforting Weekend Breakfasts  One-Pot Wonders   Fast Evening Meals  Landscape architect Your Pritikin Plate  WORKSHOPS   Healthy Mindset (Psychosocial):  Focused Goals, Sustainable Changes Clinical staff led group instruction and group discussion with PowerPoint presentation and patient guidebook. To enhance the learning environment the use of posters, models and videos may be added. Patients will be able to apply effective goal setting strategies to establish at least one personal goal, and then take consistent, meaningful action toward that goal. They will learn to identify common barriers to achieving personal goals and develop strategies to overcome them. Patients will also gain an understanding of how our mind-set can impact our ability to achieve goals and the importance of cultivating a positive and growth-oriented mind-set. The purpose of this lesson is to  provide patients with a deeper understanding of how to set and achieve personal goals, as well as the tools and strategies needed to overcome common obstacles which may arise along the way.  From Head to Heart: The Power of a Healthy Outlook  Clinical staff led group instruction and group discussion with PowerPoint presentation and patient guidebook. To enhance the learning environment the use of posters, models and videos may be added. Patients will be able to recognize and describe the impact of emotions and mood on physical health. They will discover the importance of self-care and explore self-care practices which may work for them. Patients will also learn how to utilize the 4 C's to cultivate a healthier outlook and better manage stress and challenges. The purpose of this lesson is to demonstrate to patients how a healthy outlook is an essential part of maintaining good health, especially as they continue their cardiac rehab journey.  Healthy Sleep for a Healthy Heart Clinical staff led group instruction and group discussion with PowerPoint presentation and patient guidebook. To enhance the learning environment the use of posters, models and videos may be added. At the conclusion of this workshop, patients will be able to demonstrate knowledge of the importance of sleep to overall health, well-being, and quality of life. They will understand the symptoms of, and treatments for, common sleep disorders. Patients will also be able to identify daytime and nighttime behaviors which impact sleep, and they will be able to apply these tools to help manage sleep-related challenges. The purpose of this lesson is to provide patients with a general overview of sleep and outline the importance of quality sleep. Patients will learn about a few of the most common sleep disorders. Patients will also be introduced to the concept of "sleep hygiene," and discover ways to self-manage certain sleeping problems through  simple daily behavior changes. Finally, the workshop will motivate patients by clarifying the links between quality sleep and their goals of heart-healthy living.   Recognizing and Reducing Stress Clinical staff led group instruction and group discussion with PowerPoint presentation and patient guidebook. To enhance the learning environment the use of posters, models and videos may be added. At the conclusion of this workshop, patients will be able to understand the types of stress reactions, differentiate between acute and chronic stress, and recognize the impact that chronic stress has  on their health. They will also be able to apply different coping mechanisms, such as reframing negative self-talk. Patients will have the opportunity to practice a variety of stress management techniques, such as deep abdominal breathing, progressive muscle relaxation, and/or guided imagery.  The purpose of this lesson is to educate patients on the role of stress in their lives and to provide healthy techniques for coping with it.  Learning Barriers/Preferences:  Learning Barriers/Preferences - 09/05/22 0951       Learning Barriers/Preferences   Learning Barriers None    Learning Preferences Video;Skilled Demonstration             Education Topics:  Knowledge Questionnaire Score:  Knowledge Questionnaire Score - 09/05/22 1004       Knowledge Questionnaire Score   Pre Score 18/24             Core Components/Risk Factors/Patient Goals at Admission:  Personal Goals and Risk Factors at Admission - 09/05/22 1011       Core Components/Risk Factors/Patient Goals on Admission    Weight Management Yes;Obesity;Weight Loss    Intervention Weight Management/Obesity: Establish reasonable short term and long term weight goals.;Obesity: Provide education and appropriate resources to help participant work on and attain dietary goals.    Admit Weight 227 lb 1.2 oz (103 kg)    Expected Outcomes Short Term:  Continue to assess and modify interventions until short term weight is achieved;Long Term: Adherence to nutrition and physical activity/exercise program aimed toward attainment of established weight goal;Weight Loss: Understanding of general recommendations for a balanced deficit meal plan, which promotes 1-2 lb weight loss per week and includes a negative energy balance of [phone] kcal/d    Hypertension Yes    Intervention Provide education on lifestyle modifcations including regular physical activity/exercise, weight management, moderate sodium restriction and increased consumption of fresh fruit, vegetables, and low fat dairy, alcohol moderation, and smoking cessation.;Monitor prescription use compliance.    Expected Outcomes Short Term: Continued assessment and intervention until BP is < 140/95mm HG in hypertensive participants. < 130/80mm HG in hypertensive participants with diabetes, heart failure or chronic kidney disease.;Long Term: Maintenance of blood pressure at goal levels.    Lipids Yes    Intervention Provide education and support for participant on nutrition & aerobic/resistive exercise along with prescribed medications to achieve LDL 70mg , HDL >40mg .    Expected Outcomes Short Term: Participant states understanding of desired cholesterol values and is compliant with medications prescribed. Participant is following exercise prescription and nutrition guidelines.;Long Term: Cholesterol controlled with medications as prescribed, with individualized exercise RX and with personalized nutrition plan. Value goals: LDL < 70mg , HDL > 40 mg.             Core Components/Risk Factors/Patient Goals Review:   Goals and Risk Factor Review     Row Name 10/10/22 0845             Core Components/Risk Factors/Patient Goals Review   Personal Goals Review Weight Management/Obesity;Hypertension;Lipids       Review Brian Elliott has been doing well with exercise at cardiac rehab. vital signs have been  stable. Brian Elliott's last day of attendance was on 09/27/22 as Brian Elliott is currently out of town       Expected Outcomes Brian Elliott will continue to participate in cardiac rehab for exercise, nutrition and lifestyle modifications                Core Components/Risk Factors/Patient Goals at Discharge (Final Review):   Goals and Risk Factor  Review - 10/10/22 0845       Core Components/Risk Factors/Patient Goals Review   Personal Goals Review Weight Management/Obesity;Hypertension;Lipids    Review Brian Elliott has been doing well with exercise at cardiac rehab. vital signs have been stable. Brian Elliott's last day of attendance was on 09/27/22 as Brian Elliott is currently out of town    Expected Outcomes Brian Elliott will continue to participate in cardiac rehab for exercise, nutrition and lifestyle modifications             ITP Comments:  ITP Comments     Row Name 09/05/22 0800 09/11/22 1327 10/10/22 0843       ITP Comments Medical Director- Dr. Armanda Magic, MD. Introduction to the Pritikin Education Program / Intensive Cardiac Rehab. Reviewed orientation folder with patient. 30- day ITP review. Brian Elliott started cardiac rehab on 09/11/2022 and did well with exercise. Dr. Armanda Magic is medical director. 30 Day ITP Review. Brian Elliott has good participaiton when in attendance at cardiac rehab. Brian Elliott is currently out of town              Comments: See ITP comments.Thayer Headings RN BSN

## 2022-10-11 ENCOUNTER — Encounter (HOSPITAL_COMMUNITY)
Admission: RE | Admit: 2022-10-11 | Discharge: 2022-10-11 | Disposition: A | Discharge status: Home / Self Care | Payer: 59 | Source: Ambulatory Visit | Attending: Cardiology | Admitting: Cardiology

## 2022-10-11 DIAGNOSIS — Z955 Presence of coronary angioplasty implant and graft: Secondary | ICD-10-CM

## 2022-10-12 ENCOUNTER — Encounter: Payer: Self-pay | Admitting: Cardiology

## 2022-10-12 ENCOUNTER — Ambulatory Visit: Payer: No Typology Code available for payment source | Admitting: Cardiology

## 2022-10-12 VITALS — BP 119/75 | HR 75 | Resp 16 | Ht 70.0 in | Wt 222.0 lb

## 2022-10-12 DIAGNOSIS — Z955 Presence of coronary angioplasty implant and graft: Secondary | ICD-10-CM

## 2022-10-12 DIAGNOSIS — I10 Essential (primary) hypertension: Secondary | ICD-10-CM

## 2022-10-12 DIAGNOSIS — I2510 Atherosclerotic heart disease of native coronary artery without angina pectoris: Secondary | ICD-10-CM

## 2022-10-12 DIAGNOSIS — E782 Mixed hyperlipidemia: Secondary | ICD-10-CM

## 2022-10-12 NOTE — Progress Notes (Signed)
ID:  Brian Elliott, DOB May 26, 1965, MRN [id number]  PCP:  Charlane Ferretti, DO  Cardiologist:  Tessa Lerner, DO, [facility] (established care 08/03/22)  Date: 10/12/22 Last Office Visit: 08/22/2022  Chief Complaint  Patient presents with   Coronary Artery Disease   Follow-up    7 weeks    HPI  Brian Elliott is a 57 y.o. Caucasian male whose past medical history and cardiovascular risk factors include: CAD status post angioplasty and stenting to the proximal/mid LAD, Hypothyroidism, hyperlipidemia, OSA status post UP3 and on CPAP, GERD, erectile dysfunction, former smoker.   In May 2024 patient presented to the office for evaluation of chest pain and hyperlipidemia.  Symptoms were concerning for unstable angina.  He was electively admitted and underwent angiography which noted obstructive disease.  He underwent intervention to the LAD.   At last office visit we discussed the importance of improving his modifiable cardiovascular risk factors.  Since his lipids were not at goal we also started the Zetia.  Repeat labs on 10/11/2022 notes direct LDL at 54 mg/dL, AST and ALT are within normal limits.  Since last office visit, denies anginal chest pain or heart failure symptoms.  He has started cardiac rehab and feels well.  He also tries to walk at least 2 miles on a daily basis.  He would like to come off is on the medications as discussed below.  ALLERGIES: No Known Allergies  MEDICATION LIST PRIOR TO VISIT: Current Meds  Medication Sig   acetaminophen (TYLENOL) 500 MG tablet Take 500 mg by mouth every 6 (six) hours as needed for moderate pain.   atorvastatin (LIPITOR) 40 MG tablet Take 1 tablet (40 mg total) by mouth daily.   [employer] ASPIRIN LOW DOSE 81 MG tablet Take 81 mg by mouth daily.   ezetimibe (ZETIA) 10 MG tablet Take 1 tablet (10 mg total) by mouth daily.   famotidine (PEPCID) 20 MG tablet Take 20 mg by mouth daily.   fexofenadine (ALLEGRA) 180 MG tablet Take 180 mg by mouth  daily.   isosorbide mononitrate (IMDUR) 30 MG 24 hr tablet Take 1 tablet (30 mg total) by mouth at bedtime.   levothyroxine (SYNTHROID) 150 MCG tablet Take 150 mcg by mouth daily.   losartan (COZAAR) 25 MG tablet Take 0.5 tablets (12.5 mg total) by mouth at bedtime.   MAGNESIUM PO Take 200 mg by mouth in the morning and at bedtime.   metoprolol succinate (TOPROL-XL) 25 MG 24 hr tablet Take 1 tablet (25 mg total) by mouth daily. Take with or immediately following a meal.   Multiple Vitamin (MULTIVITAMIN) tablet Take 1 tablet by mouth daily.   nitroGLYCERIN (NITROSTAT) 0.4 MG SL tablet Place 0.4 mg under the tongue every 5 (five) minutes as needed for chest pain.   Omega-3 Fatty Acids (FISH OIL PO) Take 1 capsule by mouth daily.   sertraline (ZOLOFT) 25 MG tablet Take 1 tablet (25 mg total) by mouth daily.   ticagrelor (BRILINTA) 90 MG TABS tablet Take 1 tablet (90 mg total) by mouth 2 (two) times daily.   vitamin E 180 MG (400 UNITS) capsule Take 400 Units by mouth daily.   zinc gluconate 50 MG tablet Take 50 mg by mouth in the morning and at bedtime.   [DISCONTINUED] omeprazole (PRILOSEC) 40 MG capsule Take 1 capsule (40 mg total) by mouth daily.     PAST MEDICAL HISTORY: Past Medical History:  Diagnosis Date   Allergy    seasonal  Coronary artery disease    GERD (gastroesophageal reflux disease)    Hyperlipidemia    Hypothyroid    Sleep apnea    wears cpap    PAST SURGICAL HISTORY: Past Surgical History:  Procedure Laterality Date   ADENOIDECTOMY  1994   CARDIAC CATHETERIZATION     COLONOSCOPY  10/15/2014   Russella Dar   CORONARY STENT INTERVENTION N/A 08/04/2022   Procedure: CORONARY STENT INTERVENTION;  Surgeon: Yates Decamp, MD;  Location: [facility] INVASIVE CV LAB;  Service: Cardiovascular;  Laterality: N/A;   LEFT HEART CATH AND CORONARY ANGIOGRAPHY N/A 08/04/2022   Procedure: LEFT HEART CATH AND CORONARY ANGIOGRAPHY;  Surgeon: Yates Decamp, MD;  Location: [facility] INVASIVE CV LAB;  Service:  Cardiovascular;  Laterality: N/A;   POLYPECTOMY     UVULOPALATOPHARYNGOPLASTY  1994    FAMILY HISTORY: The patient family history includes Colon polyps in his father; Emphysema in his father; Heart disease in his mother; Liver cancer in his maternal grandmother and paternal grandfather; Pancreatic cancer in his brother; Prostate cancer in his maternal grandfather.  SOCIAL HISTORY:  The patient  reports that he quit smoking about 29 years ago. His smoking use included cigarettes and cigars. He started smoking about 44 years ago. He has a 30 pack-year smoking history. He has never used smokeless tobacco. He reports current alcohol use of about 2.0 standard drinks of alcohol per week. He reports that he does not use drugs.  REVIEW OF SYSTEMS: Review of Systems  Cardiovascular:  Negative for chest pain, claudication, dyspnea on exertion, irregular heartbeat, leg swelling, near-syncope, orthopnea, palpitations, paroxysmal nocturnal dyspnea and syncope.  Respiratory:  Negative for shortness of breath.   Hematologic/Lymphatic: Negative for bleeding problem.  Musculoskeletal:  Negative for muscle cramps and myalgias.  Gastrointestinal:  Negative for heartburn.  Neurological:  Negative for dizziness and light-headedness.    PHYSICAL EXAM:    10/12/2022   12:59 PM 09/05/2022    8:00 AM 08/22/2022    1:53 PM  Vitals with BMI  Height 5\' 10"  5\' 10"    Weight 222 lbs 227 lbs 1 oz   BMI 31.85 32.58   Systolic 119 108 595  Diastolic 75 70 89  Pulse 75 81 59    Physical Exam  Constitutional: No distress.  Age appropriate, hemodynamically stable.   Neck: No JVD present.  Cardiovascular: Normal rate, regular rhythm, S1 normal, S2 normal, intact distal pulses and normal pulses. Exam reveals no gallop, no S3 and no S4.  No murmur heard. Pulses:      Radial pulses are 2+ on the right side and 2+ on the left side.       Dorsalis pedis pulses are 2+ on the right side and 2+ on the left side.        Posterior tibial pulses are 2+ on the right side and 2+ on the left side.  Pulmonary/Chest: Effort normal and breath sounds normal. No stridor. He has no wheezes. He has no rales.  Abdominal: Soft. Bowel sounds are normal. He exhibits no distension. There is no abdominal tenderness.  Musculoskeletal:        General: No edema.     Cervical back: Neck supple.  Neurological: He is alert and oriented to person, place, and time. He has intact cranial nerves (2-12).  Skin: Skin is warm and moist.   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  Echocardiogram: Aug 04, 2022: LVEF 55 to 60%, normal diastolic function,  RVSP 29.5 mmHg, estimated RAP 8 mmHg, no significant valvular heart disease.   Stress Testing: No results found for this or any previous visit from the past 1095 days.   Left Heart Catheterization 08/04/22:  LV: 129/6, EDP 16 mmHg.  Ao 121/62, mean 90 mmHg.  No pressure gradient across the aortic valve. LVEF 55% without wall motion abnormality. LM: Large-caliber vessel.  Smooth and normal. LAD: Large-caliber vessel.  Gives origin to small-sized D1 and a moderate to large size D2.  The proximal LAD from the origin of the septal perforator 1 all the way to the mid segment has a high-grade 99% stenosis followed by a tandem 80% stenosis of the origin of D2.  TIMI II flow.  Diagonals are widely patent and normal. Cx: Moderate caliber vessel, smooth and normal. RI: Very large caliber vessel.  Smooth and normal. RCA: Dominant.  Smooth and normal.      Intervention data: Successful PTCA and stenting of the proximal and mid LAD with implantation of  3.0 x 38 mm Synergy XD DES postdilated with a 3.5 mm Solvay balloon at 16 atmospheric pressure.  Stenosis reduced from 99% to 0% with TIMI II to TIMI-3 flow at the end of the procedure.   Recommendation: Patient will need DAPT for 1 year in view of ACS.  Risk factor modification  indicated.  LABORATORY DATA:  External Labs: Collected: October 13, 2021 Cholesterol 201, HDL 45, LDL 117, non-HDL 156, triglycerides 195  Collected: Jul 27, 2022 provided by the patient. Hemoglobin 14.4, hematocrit 43.8% eGFR 62.2. Sodium 138, potassium 4.3, chloride 104, bicarb 25. AST 28, ALT 232, alkaline phosphatase 58  Lab Results  Component Value Date   CHOL 114 10/11/2022   HDL 35 (L) 10/11/2022   LDLCALC 56 10/11/2022   LDLDIRECT 54 10/11/2022   TRIG 126 10/11/2022   CHOLHDL 4.2 08/04/2022     IMPRESSION:    ICD-10-CM   1. Atherosclerosis of native coronary artery of native heart without angina pectoris  I25.10     2. H/O heart artery stent  Z95.5     3. Benign hypertension  I10     4. Mixed hyperlipidemia  E78.2         RECOMMENDATIONS: Brian Elliott is a 57 y.o. Caucasian male whose past medical history and cardiac risk factors include: CAD status post angioplasty and stenting to the proximal/mid LAD, Hypothyroidism, hyperlipidemia, OSA status post UP3 and on CPAP, GERD, erectile dysfunction, former smoker.   Atherosclerosis of native coronary artery of native heart without angina pectoris Presented as unstable angina May 2024 History of successful PTCA and stenting of the proximal/mid LAD Denies angina pectoris.   No use of sublingual nitroglycerin tablets last office visit. Good functional capacity status post coronary intervention. Has started cardiac rehab and enjoys it. At the last office visit added Zetia to his current dose of atorvastatin.  LDL as of July 2024 at goal (54 mg/dL). Recent echocardiogram, angiograms, reviewed as part of medical decision making. He was started on sertraline by my partner in the past-we discussed weaning him off over the next 2 weeks. He is currently on omeprazole and famotidine -will discontinue omeprazole and he will follow-up with PCP with regards to heartburn/GERD management.   Benign essential  hypertension: Office blood pressures are well-controlled. No medication changes warranted at this time.  Hyperlipidemia, mixed Has done well with initiation of Zetia. Continue atorvastatin. Most recent lipid profile notes a direct LDL of 54 mg/dL. Monitor for now  FINAL MEDICATION LIST END OF ENCOUNTER: No orders of the defined types were placed in this encounter.   Medications Discontinued During This Encounter  Medication Reason   Omega-3 Fatty Acids (FISH OIL) 1200 MG CAPS    omeprazole (PRILOSEC) 40 MG capsule      Current Outpatient Medications:    acetaminophen (TYLENOL) 500 MG tablet, Take 500 mg by mouth every 6 (six) hours as needed for moderate pain., Disp: , Rfl:    atorvastatin (LIPITOR) 40 MG tablet, Take 1 tablet (40 mg total) by mouth daily., Disp: 30 tablet, Rfl: 3   CVS ASPIRIN LOW DOSE 81 MG tablet, Take 81 mg by mouth daily., Disp: , Rfl:    ezetimibe (ZETIA) 10 MG tablet, Take 1 tablet (10 mg total) by mouth daily., Disp: 90 tablet, Rfl: 3   famotidine (PEPCID) 20 MG tablet, Take 20 mg by mouth daily., Disp: , Rfl:    fexofenadine (ALLEGRA) 180 MG tablet, Take 180 mg by mouth daily., Disp: , Rfl:    isosorbide mononitrate (IMDUR) 30 MG 24 hr tablet, Take 1 tablet (30 mg total) by mouth at bedtime., Disp: 90 tablet, Rfl: 3   levothyroxine (SYNTHROID) 150 MCG tablet, Take 150 mcg by mouth daily., Disp: , Rfl:    losartan (COZAAR) 25 MG tablet, Take 0.5 tablets (12.5 mg total) by mouth at bedtime., Disp: 90 tablet, Rfl: 3   MAGNESIUM PO, Take 200 mg by mouth in the morning and at bedtime., Disp: , Rfl:    metoprolol succinate (TOPROL-XL) 25 MG 24 hr tablet, Take 1 tablet (25 mg total) by mouth daily. Take with or immediately following a meal., Disp: 30 tablet, Rfl: 3   Multiple Vitamin (MULTIVITAMIN) tablet, Take 1 tablet by mouth daily., Disp: , Rfl:    nitroGLYCERIN (NITROSTAT) 0.4 MG SL tablet, Place 0.4 mg under the tongue every 5 (five) minutes as needed for  chest pain., Disp: , Rfl:    Omega-3 Fatty Acids (FISH OIL PO), Take 1 capsule by mouth daily., Disp: , Rfl:    sertraline (ZOLOFT) 25 MG tablet, Take 1 tablet (25 mg total) by mouth daily., Disp: 90 tablet, Rfl: 3   ticagrelor (BRILINTA) 90 MG TABS tablet, Take 1 tablet (90 mg total) by mouth 2 (two) times daily., Disp: 60 tablet, Rfl: 3   vitamin E 180 MG (400 UNITS) capsule, Take 400 Units by mouth daily., Disp: , Rfl:    zinc gluconate 50 MG tablet, Take 50 mg by mouth in the morning and at bedtime., Disp: , Rfl:   No orders of the defined types were placed in this encounter.   There are no Patient Instructions on file for this visit.   --Continue cardiac medications as reconciled in final medication list. --Return in about 6 months (around 04/14/2023) for Follow up, CAD. or sooner if needed. --Continue follow-up with your primary care physician regarding the management of your other chronic comorbid conditions.  Patient's questions and concerns were addressed to his satisfaction. He voices understanding of the instructions provided during this encounter.   This note was created using a voice recognition software as a result there may be grammatical errors inadvertently enclosed that do not reflect the nature of this encounter. Every attempt is made to correct such errors.  Tessa Lerner, [place], [facility]  Pager:  [phone] Office: [phone]

## 2022-10-13 ENCOUNTER — Encounter (HOSPITAL_COMMUNITY)
Admission: RE | Admit: 2022-10-13 | Discharge: 2022-10-13 | Disposition: A | Discharge status: Home / Self Care | Payer: 59 | Source: Ambulatory Visit | Attending: Cardiology | Admitting: Cardiology

## 2022-10-13 DIAGNOSIS — Z955 Presence of coronary angioplasty implant and graft: Secondary | ICD-10-CM | POA: Insufficient documentation

## 2022-10-16 ENCOUNTER — Encounter (HOSPITAL_COMMUNITY)
Admission: RE | Admit: 2022-10-16 | Discharge: 2022-10-16 | Disposition: A | Discharge status: Home / Self Care | Payer: 59 | Source: Ambulatory Visit | Attending: Cardiology | Admitting: Cardiology

## 2022-10-16 DIAGNOSIS — Z955 Presence of coronary angioplasty implant and graft: Secondary | ICD-10-CM

## 2022-10-18 ENCOUNTER — Encounter (HOSPITAL_COMMUNITY)
Admission: RE | Admit: 2022-10-18 | Discharge: 2022-10-18 | Disposition: A | Discharge status: Home / Self Care | Payer: 59 | Source: Ambulatory Visit | Attending: Cardiology | Admitting: Cardiology

## 2022-10-18 DIAGNOSIS — Z955 Presence of coronary angioplasty implant and graft: Secondary | ICD-10-CM

## 2022-10-19 ENCOUNTER — Encounter: Payer: Self-pay | Admitting: Cardiology

## 2022-10-19 NOTE — Telephone Encounter (Signed)
From patient.

## 2022-10-19 NOTE — Telephone Encounter (Signed)
I have no reservations.  Brian Cronce Sangrey, DO, [facility]

## 2022-10-20 ENCOUNTER — Encounter (HOSPITAL_COMMUNITY)
Admission: RE | Admit: 2022-10-20 | Discharge: 2022-10-20 | Disposition: A | Discharge status: Home / Self Care | Payer: 59 | Source: Ambulatory Visit | Attending: Cardiology | Admitting: Cardiology

## 2022-10-20 DIAGNOSIS — Z955 Presence of coronary angioplasty implant and graft: Secondary | ICD-10-CM

## 2022-10-23 ENCOUNTER — Encounter (HOSPITAL_COMMUNITY)
Admission: RE | Admit: 2022-10-23 | Discharge: 2022-10-23 | Disposition: A | Discharge status: Home / Self Care | Payer: 59 | Source: Ambulatory Visit | Attending: Cardiology | Admitting: Cardiology

## 2022-10-23 DIAGNOSIS — Z955 Presence of coronary angioplasty implant and graft: Secondary | ICD-10-CM | POA: Diagnosis not present

## 2022-10-25 ENCOUNTER — Encounter (HOSPITAL_COMMUNITY)
Admission: RE | Admit: 2022-10-25 | Discharge: 2022-10-25 | Disposition: A | Discharge status: Home / Self Care | Payer: 59 | Source: Ambulatory Visit | Attending: Cardiology | Admitting: Cardiology

## 2022-10-25 DIAGNOSIS — Z955 Presence of coronary angioplasty implant and graft: Secondary | ICD-10-CM | POA: Diagnosis not present

## 2022-10-27 ENCOUNTER — Encounter (HOSPITAL_COMMUNITY): Payer: 59

## 2022-10-30 ENCOUNTER — Encounter (HOSPITAL_COMMUNITY)
Admission: RE | Admit: 2022-10-30 | Discharge: 2022-10-30 | Disposition: A | Discharge status: Home / Self Care | Payer: 59 | Source: Ambulatory Visit | Attending: Cardiology | Admitting: Cardiology

## 2022-10-30 DIAGNOSIS — Z955 Presence of coronary angioplasty implant and graft: Secondary | ICD-10-CM | POA: Diagnosis not present

## 2022-11-01 ENCOUNTER — Encounter (HOSPITAL_COMMUNITY)
Admission: RE | Admit: 2022-11-01 | Discharge: 2022-11-01 | Disposition: A | Discharge status: Home / Self Care | Payer: 59 | Source: Ambulatory Visit | Attending: Cardiology | Admitting: Cardiology

## 2022-11-01 DIAGNOSIS — Z955 Presence of coronary angioplasty implant and graft: Secondary | ICD-10-CM

## 2022-11-03 ENCOUNTER — Encounter (HOSPITAL_COMMUNITY)
Admission: RE | Admit: 2022-11-03 | Discharge: 2022-11-03 | Disposition: A | Discharge status: Home / Self Care | Payer: 59 | Source: Ambulatory Visit | Attending: Cardiology | Admitting: Cardiology

## 2022-11-03 DIAGNOSIS — Z955 Presence of coronary angioplasty implant and graft: Secondary | ICD-10-CM

## 2022-11-03 NOTE — Progress Notes (Signed)
Cardiac Individual Treatment Plan  Patient Details  Name: Brian Elliott MRN: [id number] Date of Birth: 1965/12/10 Referring Provider:   Flowsheet Row INTENSIVE CARDIAC REHAB ORIENT from 09/05/2022 in [facility]  Referring Provider Tessa Lerner, DO       Initial Encounter Date:  Flowsheet Row INTENSIVE CARDIAC REHAB ORIENT from 09/05/2022 in [facility] for Heart, Vascular, & Lung Health  Date 09/05/22       Visit Diagnosis: 08/04/22 DES mLAD, pLAD  Patient's Home Medications on Admission:  Current Outpatient Medications:    acetaminophen (TYLENOL) 500 MG tablet, Take 500 mg by mouth every 6 (six) hours as needed for moderate pain., Disp: , Rfl:    atorvastatin (LIPITOR) 40 MG tablet, Take 1 tablet (40 mg total) by mouth daily., Disp: 30 tablet, Rfl: 3   [employer] ASPIRIN LOW DOSE 81 MG tablet, Take 81 mg by mouth daily., Disp: , Rfl:    ezetimibe (ZETIA) 10 MG tablet, Take 1 tablet (10 mg total) by mouth daily., Disp: 90 tablet, Rfl: 3   famotidine (PEPCID) 20 MG tablet, Take 20 mg by mouth daily., Disp: , Rfl:    fexofenadine (ALLEGRA) 180 MG tablet, Take 180 mg by mouth daily., Disp: , Rfl:    isosorbide mononitrate (IMDUR) 30 MG 24 hr tablet, Take 1 tablet (30 mg total) by mouth at bedtime., Disp: 90 tablet, Rfl: 3   levothyroxine (SYNTHROID) 150 MCG tablet, Take 150 mcg by mouth daily., Disp: , Rfl:    losartan (COZAAR) 25 MG tablet, Take 0.5 tablets (12.5 mg total) by mouth at bedtime., Disp: 90 tablet, Rfl: 3   MAGNESIUM PO, Take 200 mg by mouth in the morning and at bedtime., Disp: , Rfl:    metoprolol succinate (TOPROL-XL) 25 MG 24 hr tablet, Take 1 tablet (25 mg total) by mouth daily. Take with or immediately following a meal., Disp: 30 tablet, Rfl: 3   Multiple Vitamin (MULTIVITAMIN) tablet, Take 1 tablet by mouth daily., Disp: , Rfl:    nitroGLYCERIN (NITROSTAT) 0.4 MG SL tablet, Place 0.4 mg  under the tongue every 5 (five) minutes as needed for chest pain., Disp: , Rfl:    Omega-3 Fatty Acids (FISH OIL PO), Take 1 capsule by mouth daily., Disp: , Rfl:    sertraline (ZOLOFT) 25 MG tablet, Take 1 tablet (25 mg total) by mouth daily., Disp: 90 tablet, Rfl: 3   ticagrelor (BRILINTA) 90 MG TABS tablet, Take 1 tablet (90 mg total) by mouth 2 (two) times daily., Disp: 60 tablet, Rfl: 3   vitamin E 180 MG (400 UNITS) capsule, Take 400 Units by mouth daily., Disp: , Rfl:    zinc gluconate 50 MG tablet, Take 50 mg by mouth in the morning and at bedtime., Disp: , Rfl:   Past Medical History: Past Medical History:  Diagnosis Date   Allergy    seasonal   Coronary artery disease    GERD (gastroesophageal reflux disease)    Hyperlipidemia    Hypothyroid    Sleep apnea    wears cpap    Tobacco Use: Social History   Tobacco Use  Smoking Status Former   Current packs/day: 0.00   Average packs/day: 2.0 packs/day for 15.0 years (30.0 ttl pk-yrs)   Types: Cigarettes, Cigars   Start date: 03/13/1978   Quit date: 03/13/1993   Years since quitting: 29.6  Smokeless Tobacco Never  Tobacco Comments   occ Cigars  Labs: Review Flowsheet       Latest Ref Rng & Units 08/03/2022 08/04/2022 10/11/2022  Labs for ITP Cardiac and Pulmonary Rehab  Cholestrol 100 - 199 mg/dL - 782  956   LDL (calc) 0 - 99 mg/dL - 90  56   Direct LDL 0 - 99 mg/dL - 97  54   HDL-C >21 mg/dL - 36  35   Trlycerides 0 - 149 mg/dL - 308  657   Hemoglobin A1c 4.8 - 5.6 % 5.9  - -    Details            Capillary Blood Glucose: No results found for: "GLUCAP"   Exercise Target Goals: Exercise Program Goal: Individual exercise prescription set using results from initial 6 min walk test and THRR while considering  patient's activity barriers and safety.   Exercise Prescription Goal: Initial exercise prescription builds to 30-45 minutes a day of aerobic activity, 2-3 days per week.  Home exercise guidelines  will be given to patient during program as part of exercise prescription that the participant will acknowledge.  Activity Barriers & Risk Stratification:  Activity Barriers & Cardiac Risk Stratification - 09/05/22 0907       Activity Barriers & Cardiac Risk Stratification   Activity Barriers Other (comment)    Comments Left knee bothers him occassionally.    Cardiac Risk Stratification Low             6 Minute Walk:  6 Minute Walk     Row Name 09/05/22 0917         6 Minute Walk   Phase Initial     Distance 1872 feet     Walk Time 6 minutes     # of Rest Breaks 0     MPH 3.54     METS 4.12     RPE 9     Perceived Dyspnea  0     VO2 Peak 14.43     Symptoms No     Resting HR 81 bpm     Resting BP 108/70     Resting Oxygen Saturation  97 %     Exercise Oxygen Saturation  during 6 min walk 98 %     Max Ex. HR 100 bpm     Max Ex. BP 110/64     2 Minute Post BP 108/72              Oxygen Initial Assessment:   Oxygen Re-Evaluation:   Oxygen Discharge (Final Oxygen Re-Evaluation):   Initial Exercise Prescription:  Initial Exercise Prescription - 09/05/22 1000       Date of Initial Exercise RX and Referring Provider   Date 09/05/22    Referring Provider Tessa Lerner, DO    Expected Discharge Date 11/17/22      Bike   Level 2    Watts 50    METs 3.8      NuStep   Level 3    SPM 85    Minutes 15    METs 3.2      Prescription Details   Frequency (times per week) 3    Duration Progress to 30 minutes of continuous aerobic without signs/symptoms of physical distress      Intensity   THRR 40-80% of Max Heartrate 65-130    Ratings of Perceived Exertion 11-13    Perceived Dyspnea 0-4      Progression   Progression Continue to progress workloads to maintain intensity without signs/symptoms  of physical distress.      Resistance Training   Training Prescription Yes    Weight 5 lbs    Reps 10-15             Perform Capillary Blood Glucose  checks as needed.  Exercise Prescription Changes:   Exercise Prescription Changes     Row Name 09/11/22 0800 09/27/22 0825 10/13/22 0834 10/25/22 0823       Response to Exercise   Blood Pressure (Admit) 112/72 110/76 96/61 96/61     Blood Pressure (Exercise) 130/80 124/80 138/70 138/70    Blood Pressure (Exit) 118/70 100/70 95/63 95/63     Heart Rate (Admit) 76 bpm 62 bpm 66 bpm 66 bpm    Heart Rate (Exercise) 100 bpm 102 bpm 116 bpm 116 bpm    Heart Rate (Exit) 75 bpm 71 bpm 73 bpm 73 bpm    Rating of Perceived Exertion (Exercise) 8 11.5 11.5 11.5    Perceived Dyspnea (Exercise) 0 0 0 0    Symptoms 0 0 0 0    Comments Pt first day in teh [facility] ICR program REVD MET's, goals and home ExRx REVD MET's REVD MET's and goals    Duration Progress to 30 minutes of  aerobic without signs/symptoms of physical distress Progress to 30 minutes of  aerobic without signs/symptoms of physical distress Progress to 30 minutes of  aerobic without signs/symptoms of physical distress Progress to 30 minutes of  aerobic without signs/symptoms of physical distress    Intensity THRR unchanged THRR unchanged THRR unchanged THRR unchanged      Progression   Progression Continue to progress workloads to maintain intensity without signs/symptoms of physical distress. Continue to progress workloads to maintain intensity without signs/symptoms of physical distress. Continue to progress workloads to maintain intensity without signs/symptoms of physical distress. Continue to progress workloads to maintain intensity without signs/symptoms of physical distress.    Average METs 2.5 3.35 4 4      Resistance Training   Training Prescription Yes No Yes No    Weight 5 -- 5 lbs wts --    Reps 10-15 -- 10-15 --    Time 10 Minutes -- 10 Minutes --      Bike   Level 2 3 3  3.7    Watts 29 45 55 75    Minutes 15 15 15 15     METs 2.7 3.4 3.8 4.4      NuStep   Level 3 4 4 4     SPM -- 114 123 --    Minutes 15 15 15 15      METs 2.3 3.3 4.2 4.4      Home Exercise Plan   Plans to continue exercise at -- Home (comment) Home (comment) Home (comment)    Frequency -- Add 4 additional days to program exercise sessions. Add 4 additional days to program exercise sessions. Add 4 additional days to program exercise sessions.    Initial Home Exercises Provided -- 09/27/22 09/27/22 09/27/22             Exercise Comments:   Exercise Comments     Row Name 09/11/22 0848 09/27/22 4742 10/13/22 0845 10/25/22 0846     Exercise Comments Pt first day in the CRP2 program. Pt tolerated exercise well with an average MET level of 2.5. Pt is off to a good start and is learning his THRR, RPE and ExRx. Will continue to monitor pt and progress workloads as tolerated without sign or symptom REVD MET's,  goals and home ExRx. Pt tolerated exercise well with an average MET level of 3.35. Pt is doing well and progressing his MET's, he INC WL on both stations today. Pt feels good about his goals of getting into a regular Ex routine and eating a more heart healthy diet. His family is very supportive. He will continue to exercise bywalking, using his treadmill, stationary bike and handweights 2-4 days for 30-45 mins REVD MET's today. Pt tolerated exercise well with an average MET level of 4.0. Pt is progressing well. He has been out of town for work, but is settling back into his exercise routine with some mild leg fatigue, he was able to walk while away and has already increased MET's since last review. Will continue to increase WL as tolerated without S/S REVD MET's and goals. Pt tolerated exercise well with an average MET level of 4.4. Pt feels good about his goals and is increasing strength and stamina. He is walking more and he just got an e-bike that he's enjoying.             Exercise Goals and Review:   Exercise Goals     Row Name 09/05/22 0855             Exercise Goals   Increase Physical Activity Yes       Intervention  Provide advice, education, support and counseling about physical activity/exercise needs.;Develop an individualized exercise prescription for aerobic and resistive training based on initial evaluation findings, risk stratification, comorbidities and participant's personal goals.       Expected Outcomes Short Term: Attend rehab on a regular basis to increase amount of physical activity.;Long Term: Exercising regularly at least 3-5 days a week.;Long Term: Add in home exercise to make exercise part of routine and to increase amount of physical activity.       Increase Strength and Stamina Yes       Intervention Provide advice, education, support and counseling about physical activity/exercise needs.;Develop an individualized exercise prescription for aerobic and resistive training based on initial evaluation findings, risk stratification, comorbidities and participant's personal goals.       Expected Outcomes Short Term: Increase workloads from initial exercise prescription for resistance, speed, and METs.;Short Term: Perform resistance training exercises routinely during rehab and add in resistance training at home;Long Term: Improve cardiorespiratory fitness, muscular endurance and strength as measured by increased METs and functional capacity ( )       Able to understand and use rate of perceived exertion (RPE) scale Yes       Intervention Provide education and explanation on how to use RPE scale       Expected Outcomes Short Term: Able to use RPE daily in rehab to express subjective intensity level;Long Term:  Able to use RPE to guide intensity level when exercising independently       Knowledge and understanding of Target Heart Rate Range (THRR) Yes       Intervention Provide education and explanation of THRR including how the numbers were predicted and where they are located for reference       Expected Outcomes Short Term: Able to state/look up THRR;Short Term: Able to use daily as guideline for  intensity in rehab;Long Term: Able to use THRR to govern intensity when exercising independently       Able to check pulse independently Yes       Intervention Provide education and demonstration on how to check pulse in carotid and radial arteries.;Review the importance of  being able to check your own pulse for safety during independent exercise       Expected Outcomes Short Term: Able to explain why pulse checking is important during independent exercise;Long Term: Able to check pulse independently and accurately       Understanding of Exercise Prescription Yes       Intervention Provide education, explanation, and written materials on patient's individual exercise prescription       Expected Outcomes Short Term: Able to explain program exercise prescription;Long Term: Able to explain home exercise prescription to exercise independently                Exercise Goals Re-Evaluation :  Exercise Goals Re-Evaluation     Row Name 09/11/22 0847 09/27/22 0827 10/25/22 0840         Exercise Goal Re-Evaluation   Exercise Goals Review Increase Physical Activity;Understanding of Exercise Prescription;Increase Strength and Stamina;Knowledge and understanding of Target Heart Rate Range (THRR);Able to understand and use rate of perceived exertion (RPE) scale Increase Physical Activity;Understanding of Exercise Prescription;Increase Strength and Stamina;Knowledge and understanding of Target Heart Rate Range (THRR);Able to understand and use rate of perceived exertion (RPE) scale Increase Physical Activity;Understanding of Exercise Prescription;Increase Strength and Stamina;Knowledge and understanding of Target Heart Rate Range (THRR);Able to understand and use rate of perceived exertion (RPE) scale     Comments Pt first day in the CRP2 program. Pt tolerated exercise well with an average MET level of 2.5. Pt is off to a good start and is learning his THRR, RPE and ExRx. REVD MET's, goals and home ExRx. Pt  tolerated exercise well with an average MET level of 3.35. Pt is doing well and progressing his MET's, he INC WL on both stations today. Pt feels good about his goals of getting into a regular Ex routine and eating a more heart healthy diet. His family is very supportive. He will continue to exercise bywalking, using his treadmill, stationary bike and handweights 2-4 days for 30-45 mins REVD MET's and goals. Pt tolerated exercise well with an average MET level of 4.4. Pt feels good about his goals and is increasing strength and stamina. He is walking more and he just got an e-bike that he's enjoying.     Expected Outcomes Will continue to monitor pt and progress workloads as tolerated without sign or symptom Will continue to monitor pt and progress workloads as tolerated without sign or symptom Will continue to monitor pt and progress workloads as tolerated without sign or symptom              Discharge Exercise Prescription (Final Exercise Prescription Changes):  Exercise Prescription Changes - 10/25/22 0823       Response to Exercise   Blood Pressure (Admit) 96/61    Blood Pressure (Exercise) 138/70    Blood Pressure (Exit) 95/63    Heart Rate (Admit) 66 bpm    Heart Rate (Exercise) 116 bpm    Heart Rate (Exit) 73 bpm    Rating of Perceived Exertion (Exercise) 11.5    Perceived Dyspnea (Exercise) 0    Symptoms 0    Comments REVD MET's and goals    Duration Progress to 30 minutes of  aerobic without signs/symptoms of physical distress    Intensity THRR unchanged      Progression   Progression Continue to progress workloads to maintain intensity without signs/symptoms of physical distress.    Average METs 4      Resistance Training   Training Prescription No  Bike   Level 3.7    Watts 75    Minutes 15    METs 4.4      NuStep   Level 4    SPM --    Minutes 15    METs 4.4      Home Exercise Plan   Plans to continue exercise at Home (comment)    Frequency Add 4  additional days to program exercise sessions.    Initial Home Exercises Provided 09/27/22             Nutrition:  Target Goals: Understanding of nutrition guidelines, daily intake of sodium 1500mg , cholesterol 200mg , calories 30% from fat and 7% or less from saturated fats, daily to have 5 or more servings of fruits and vegetables.  Biometrics:  Pre Biometrics - 09/05/22 0800       Pre Biometrics   Waist Circumference 45.75 inches    Hip Circumference 42 inches    Waist to Hip Ratio 1.09 %    Triceps Skinfold 15 mm    % Body Fat 31.5 %    Grip Strength 58 kg    Flexibility 0 in    Single Leg Stand 28.68 seconds              Nutrition Therapy Plan and Nutrition Goals:  Nutrition Therapy & Goals - 10/09/22 1459       Nutrition Therapy   Diet Heart Healthy Diet    Drug/Food Interactions Statins/Certain Fruits      Personal Nutrition Goals   Nutrition Goal Patient to identify strategies for reducing cardiovascular risk by attending the [facility] education and nutrition series weekly.   goal in progress.   Personal Goal #2 Patient to improve diet quality by using the plate method as a guide for meal planning to include lean protein/plant protein, fruits, vegetables, whole grains, nonfat dairy as part of a well-balanced diet.   goal in progress.   Personal Goal #3 Patient to limit sodium to 1500mg  per day   goal in progress.   Comments Goals in progress. Shonta has not attended cardiac rehab since 7/17; but prior to absences, patient was attending the [facility] education and nutrition series regularly. He had started to make dietary chagnes including reduced sodium intake, reduced saturated fat intake, and increased dietary fiber. From his last attended session, patient was down 3.1#.  Patient will benefit from participation in intensive cardiac rehab for nutrition, exercise, and lifestyle modification.      Intervention Plan   Intervention Prescribe, educate and counsel  regarding individualized specific dietary modifications aiming towards targeted core components such as weight, hypertension, lipid management, diabetes, heart failure and other comorbidities.;Nutrition handout(s) given to patient.    Expected Outcomes Short Term Goal: Understand basic principles of dietary content, such as calories, fat, sodium, cholesterol and nutrients.;Long Term Goal: Adherence to prescribed nutrition plan.             Nutrition Assessments:  Nutrition Assessments - 09/15/22 0821       Rate Your Plate Scores   Pre Score 52            MEDIFICTS Score Key: ?70 Need to make dietary changes  40-70 Heart Healthy Diet ? 40 Therapeutic Level Cholesterol Diet   Flowsheet Row INTENSIVE CARDIAC REHAB from 09/13/2022 in [facility] for Heart, Vascular, & Lung Health  Picture Your Plate Total Score on Admission 52      Picture Your Plate Scores: <66 Unhealthy dietary pattern  with much room for improvement. 41-50 Dietary pattern unlikely to meet recommendations for good health and room for improvement. 51-60 More healthful dietary pattern, with some room for improvement.  >60 Healthy dietary pattern, although there may be some specific behaviors that could be improved.    Nutrition Goals Re-Evaluation:  Nutrition Goals Re-Evaluation     Row Name 09/11/22 1049 10/09/22 1459           Goals   Current Weight 227 lb 1.2 oz (103 kg) 223 lb 15.8 oz (101.6 kg)  weight from last attended session on 7/17      Comment lipids WNL-LDL 90, HDL 36, A1c 5.9 labs from 6/29 awaiting results. Most recent labs  lipids WNL-LDL 90, HDL 36, A1c 5.9      Expected Outcome Patient will benefit from participation in intensive cardiac rehab for nutrition, exercise, and lifestyle modification. Goals in progress. Fares has not attended cardiac rehab since 7/17; but prior to absences, patient was attending the [facility] education and nutrition series regularly. He  had started to make dietary chagnes including reduced sodium intake, reduced saturated fat intake, and increased dietary fiber. From his last attended session, patient was down 3.1#. Patient will benefit from participation in intensive cardiac rehab for nutrition, exercise, and lifestyle modification.               Nutrition Goals Re-Evaluation:  Nutrition Goals Re-Evaluation     Row Name 09/11/22 1049 10/09/22 1459           Goals   Current Weight 227 lb 1.2 oz (103 kg) 223 lb 15.8 oz (101.6 kg)  weight from last attended session on 7/17      Comment lipids WNL-LDL 90, HDL 36, A1c 5.9 labs from 5/28 awaiting results. Most recent labs  lipids WNL-LDL 90, HDL 36, A1c 5.9      Expected Outcome Patient will benefit from participation in intensive cardiac rehab for nutrition, exercise, and lifestyle modification. Goals in progress. Lewell has not attended cardiac rehab since 7/17; but prior to absences, patient was attending the [facility] education and nutrition series regularly. He had started to make dietary chagnes including reduced sodium intake, reduced saturated fat intake, and increased dietary fiber. From his last attended session, patient was down 3.1#. Patient will benefit from participation in intensive cardiac rehab for nutrition, exercise, and lifestyle modification.               Nutrition Goals Discharge (Final Nutrition Goals Re-Evaluation):  Nutrition Goals Re-Evaluation - 10/09/22 1459       Goals   Current Weight 223 lb 15.8 oz (101.6 kg)   weight from last attended session on 7/17   Comment labs from 7/30 awaiting results. Most recent labs  lipids WNL-LDL 90, HDL 36, A1c 5.9    Expected Outcome Goals in progress. Aliyan has not attended cardiac rehab since 7/17; but prior to absences, patient was attending the [facility] education and nutrition series regularly. He had started to make dietary chagnes including reduced sodium intake, reduced saturated fat intake, and  increased dietary fiber. From his last attended session, patient was down 3.1#. Patient will benefit from participation in intensive cardiac rehab for nutrition, exercise, and lifestyle modification.             Psychosocial: Target Goals: Acknowledge presence or absence of significant depression and/or stress, maximize coping skills, provide positive support system. Participant is able to verbalize types and ability to use techniques and skills needed for reducing stress  and depression.  Initial Review & Psychosocial Screening:  Initial Psych Review & Screening - 09/05/22 0927       Initial Review   Current issues with None Identified      Family Dynamics   Good Support System? Yes    Comments Pt stated he has a good support system, and he has a positive attitude toward cardiac rehab.      Barriers   Psychosocial barriers to participate in program There are no identifiable barriers or psychosocial needs.      Screening Interventions   Interventions Encouraged to exercise;Provide feedback about the scores to participant    Expected Outcomes Long Term Goal: Stressors or current issues are controlled or eliminated.;Long Term goal: The participant improves quality of Life and PHQ9 Scores as seen by post scores and/or verbalization of changes             Quality of Life Scores:  Quality of Life - 09/05/22 1004       Quality of Life   Select Quality of Life      Quality of Life Scores   Health/Function Pre 28.67 %    Socioeconomic Pre 30 %    Psych/Spiritual Pre 29.14 %    Family Pre 30 %    GLOBAL Pre 29.24 %            Scores of 19 and below usually indicate a poorer quality of life in these areas.  A difference of  2-3 points is a clinically meaningful difference.  A difference of 2-3 points in the total score of the Quality of Life Index has been associated with significant improvement in overall quality of life, self-image, physical symptoms, and general health  in studies assessing change in quality of life.  PHQ-9: Review Flowsheet       09/05/2022  Depression screen PHQ 2/9  Decreased Interest 0  Down, Depressed, Hopeless 0  PHQ - 2 Score 0  Altered sleeping 0  Tired, decreased energy 0  Change in appetite 0  Feeling bad or failure about yourself  0  Trouble concentrating 0  Moving slowly or fidgety/restless 0  Suicidal thoughts 0  PHQ-9 Score 0    Details           Interpretation of Total Score  Total Score Depression Severity:  1-4 = Minimal depression, 5-9 = Mild depression, 10-14 = Moderate depression, 15-19 = Moderately severe depression, 20-27 = Severe depression   Psychosocial Evaluation and Intervention:  Psychosocial Evaluation - 09/11/22 1322       Psychosocial Evaluation & Interventions   Interventions Encouraged to exercise with the program and follow exercise prescription    Continue Psychosocial Services  No Follow up required             Psychosocial Re-Evaluation:  Psychosocial Re-Evaluation     Row Name 10/10/22 0845 11/03/22 1610           Psychosocial Re-Evaluation   Current issues with None Identified None Identified      Comments -- Connolly completes cardiac rehab on 11/17/22. No psychosocial needs identified. No interventions needed      Interventions Encouraged to attend Cardiac Rehabilitation for the exercise Encouraged to attend Cardiac Rehabilitation for the exercise      Continue Psychosocial Services  No Follow up required No Follow up required               Psychosocial Discharge (Final Psychosocial Re-Evaluation):  Psychosocial Re-Evaluation - 11/03/22 9604  Psychosocial Re-Evaluation   Current issues with None Identified    Comments Bekim completes cardiac rehab on 11/17/22. No psychosocial needs identified. No interventions needed    Interventions Encouraged to attend Cardiac Rehabilitation for the exercise    Continue Psychosocial Services  No Follow up required              Vocational Rehabilitation: Provide vocational rehab assistance to qualifying candidates.   Vocational Rehab Evaluation & Intervention:  Vocational Rehab - 09/05/22 0953       Initial Vocational Rehab Evaluation & Intervention   Assessment shows need for Vocational Rehabilitation No             Education: Education Goals: Education classes will be provided on a weekly basis, covering required topics. Participant will state understanding/return demonstration of topics presented.    Education     Row Name 09/11/22 0900     Education   Cardiac Education Topics Pritikin   Select Core Videos     Core Videos   Educator Dietitian   Select Nutrition   Nutrition Calorie Density   Instruction Review Code 1- Verbalizes Understanding   Class Start Time 0815   Class Stop Time 0857   Class Time Calculation (min) 42 min    Row Name 09/13/22 1000     Education   Cardiac Education Topics Pritikin   Secondary school teacher School   Educator Dietitian   Weekly Topic Efficiency Cooking - Meals in a Snap   Instruction Review Code 1- Verbalizes Understanding   Class Start Time 0815   Class Stop Time 0850   Class Time Calculation (min) 35 min    Row Name 09/15/22 0800     Education   Cardiac Education Topics Pritikin   Psychologist, forensic Exercise Education   Exercise Education Move It!   Instruction Review Code 1- Verbalizes Understanding   Class Start Time [phone]   Class Stop Time 0844   Class Time Calculation (min) 33 min    Row Name 09/18/22 0900     Education   Cardiac Education Topics Pritikin   Select Workshops     Workshops   Educator Exercise Physiologist   Select Psychosocial   Psychosocial Workshop Focused Goals, Sustainable Changes   Instruction Review Code 1- Verbalizes Understanding   Class Start Time [phone]   Class Stop Time 0845   Class Time Calculation (min) 35 min     Row Name 09/20/22 0800     Education   Cardiac Education Topics Pritikin   Secondary school teacher School   Educator Dietitian   Weekly Topic One-Pot Wonders   Instruction Review Code 1- Verbalizes Understanding   Class Start Time [phone]   Class Stop Time [phone]   Class Time Calculation (min) 32 min    Row Name 09/22/22 0800     Education   Cardiac Education Topics Pritikin   Select Core Videos     Core Videos   Educator Exercise Physiologist   Select General Education   General Education Hypertension and Heart Disease   Instruction Review Code 1- Verbalizes Understanding   Class Start Time 0815   Class Stop Time 0850   Class Time Calculation (min) 35 min    Row Name 09/27/22 1000     Education   Cardiac Education Topics Pritikin   [employer]  Secondary school teacher   Weekly Topic Comforting Weekend Breakfasts   Instruction Review Code 1- Verbalizes Understanding   Class Start Time 0815   Class Stop Time [phone]   Class Time Calculation (min) 31 min    Row Name 10/11/22 1000     Education   Cardiac Education Topics Pritikin   Customer service manager   Weekly Topic [employer]- Spotlight on the [facility] Zones   Instruction Review Code 1- Verbalizes Understanding   Class Start Time 0815   Class Stop Time 0850   Class Time Calculation (min) 35 min    Row Name 10/13/22 0900     Education   Cardiac Education Topics Pritikin   Select Core Videos     Core Videos   Educator Exercise Physiologist   Select Exercise Education   Exercise Education Improving Performance   Instruction Review Code 1- Verbalizes Understanding   Class Start Time 0810   Class Stop Time 0846   Class Time Calculation (min) 36 min    Row Name 10/16/22 0900     Education   Cardiac Education Topics Pritikin   Select Workshops     Workshops   Educator Dietitian   Select Nutrition   Nutrition  Workshop Fueling a Forensic psychologist   Instruction Review Code 1- Verbalizes Understanding   Class Start Time 0815   Class Stop Time 0900   Class Time Calculation (min) 45 min    Row Name 10/18/22 0900     Education   Cardiac Education Topics Pritikin   Secondary school teacher School   Educator Dietitian   Weekly Topic Simple Sides and Sauces   Instruction Review Code 1- Verbalizes Understanding   Class Start Time 0815   Class Stop Time 0846   Class Time Calculation (min) 31 min    Row Name 10/20/22 0900     Education   Cardiac Education Topics Pritikin   Select Core Videos     Core Videos   Educator Exercise Physiologist   Select Psychosocial   Psychosocial How Our Thoughts Can Heal Our Hearts   Instruction Review Code 1- Verbalizes Understanding   Class Start Time 0807   Class Stop Time 0842   Class Time Calculation (min) 35 min    Row Name 10/23/22 1000     Education   Cardiac Education Topics Pritikin   Select Workshops     Workshops   Educator Exercise Physiologist   Select Exercise   Exercise Workshop Managing Heart Disease: Your Path to a Healthier Heart   Instruction Review Code 1- Verbalizes Understanding   Class Start Time [phone]   Class Stop Time 0908   Class Time Calculation (min) 60 min    Row Name 10/30/22 0900     Education   Cardiac Education Topics Pritikin   Select Workshops     Core Videos   Educator --   Select --   Psychosocial --   Instruction Review Code --   Class Start Time --   Class Stop Time --   Class Time Calculation (min) --     Workshops   Biomedical scientist Psychosocial   Psychosocial Workshop From Head to Heart: The Power of a Healthy Outlook   Instruction Review Code 1- Verbalizes Understanding   Class Start Time 0815   Class Stop Time 0904   Class Time Calculation (min) 49  min    Row Name 11/01/22 1000     Education   Cardiac Education Topics Pritikin   Librarian, academic School   Educator Dietitian   Weekly Topic Adding Flavor - Sodium-Free   Instruction Review Code 1- Verbalizes Understanding   Class Start Time [phone]   Class Stop Time 0845   Class Time Calculation (min) 31 min            Core Videos: Exercise    Move It!  Clinical staff conducted group or individual video education with verbal and written material and guidebook.  Patient learns the recommended Pritikin exercise program. Exercise with the goal of living a long, healthy life. Some of the health benefits of exercise include controlled diabetes, healthier blood pressure levels, improved cholesterol levels, improved heart and lung capacity, improved sleep, and better body composition. Everyone should speak with their doctor before starting or changing an exercise routine.  Biomechanical Limitations Clinical staff conducted group or individual video education with verbal and written material and guidebook.  Patient learns how biomechanical limitations can impact exercise and how we can mitigate and possibly overcome limitations to have an impactful and balanced exercise routine.  Body Composition Clinical staff conducted group or individual video education with verbal and written material and guidebook.  Patient learns that body composition (ratio of muscle mass to fat mass) is a key component to assessing overall fitness, rather than body weight alone. Increased fat mass, especially visceral belly fat, can put [place] at increased risk for metabolic syndrome, type 2 diabetes, heart disease, and even death. It is recommended to combine diet and exercise (cardiovascular and resistance training) to improve your body composition. Seek guidance from your physician and exercise physiologist before implementing an exercise routine.  Exercise Action Plan Clinical staff conducted group or individual video education with verbal and written material and guidebook.  Patient learns the recommended  strategies to achieve and enjoy long-term exercise adherence, including variety, self-motivation, self-efficacy, and positive decision making. Benefits of exercise include fitness, good health, weight management, more energy, better sleep, less stress, and overall well-being.  Medical   Heart Disease Risk Reduction Clinical staff conducted group or individual video education with verbal and written material and guidebook.  Patient learns our heart is our most vital organ as it circulates oxygen, nutrients, white blood cells, and hormones throughout the entire body, and carries waste away. Data supports a plant-based eating plan like the Pritikin Program for its effectiveness in slowing progression of and reversing heart disease. The video provides a number of recommendations to address heart disease.   Metabolic Syndrome and Belly Fat  Clinical staff conducted group or individual video education with verbal and written material and guidebook.  Patient learns what metabolic syndrome is, how it leads to heart disease, and how one can reverse it and keep it from coming back. You have metabolic syndrome if you have 3 of the following 5 criteria: abdominal obesity, high blood pressure, high triglycerides, low HDL cholesterol, and high blood sugar.  Hypertension and Heart Disease Clinical staff conducted group or individual video education with verbal and written material and guidebook.  Patient learns that high blood pressure, or hypertension, is very common in the [place]. Hypertension is largely due to excessive salt intake, but other important risk factors include being overweight, physical inactivity, drinking too much alcohol, smoking, and not eating enough potassium from fruits and vegetables. High blood pressure is a leading risk factor  for heart attack, stroke, congestive heart failure, dementia, kidney failure, and premature death. Long-term effects of excessive salt intake include stiffening  of the arteries and thickening of heart muscle and organ damage. Recommendations include ways to reduce hypertension and the risk of heart disease.  Diseases of Our Time - Focusing on Diabetes Clinical staff conducted group or individual video education with verbal and written material and guidebook.  Patient learns why the best way to stop diseases of our time is prevention, through food and other lifestyle changes. Medicine (such as prescription pills and surgeries) is often only a Band-Aid on the problem, not a long-term solution. Most common diseases of our time include obesity, type 2 diabetes, hypertension, heart disease, and cancer. The Pritikin Program is recommended and has been proven to help reduce, reverse, and/or prevent the damaging effects of metabolic syndrome.  Nutrition   Overview of the Pritikin Eating Plan  Clinical staff conducted group or individual video education with verbal and written material and guidebook.  Patient learns about the Pritikin Eating Plan for disease risk reduction. The Pritikin Eating Plan emphasizes a wide variety of unrefined, minimally-processed carbohydrates, like fruits, vegetables, whole grains, and legumes. Go, Caution, and Stop food choices are explained. Plant-based and lean animal proteins are emphasized. Rationale provided for low sodium intake for blood pressure control, low added sugars for blood sugar stabilization, and low added fats and oils for coronary artery disease risk reduction and weight management.  Calorie Density  Clinical staff conducted group or individual video education with verbal and written material and guidebook.  Patient learns about calorie density and how it impacts the Pritikin Eating Plan. Knowing the characteristics of the food you choose will help you decide whether those foods will lead to weight gain or weight loss, and whether you want to consume more or less of them. Weight loss is usually a side effect of the  Pritikin Eating Plan because of its focus on low calorie-dense foods.  Label Reading  Clinical staff conducted group or individual video education with verbal and written material and guidebook.  Patient learns about the Pritikin recommended label reading guidelines and corresponding recommendations regarding calorie density, added sugars, sodium content, and whole grains.  Dining Out - Part 1  Clinical staff conducted group or individual video education with verbal and written material and guidebook.  Patient learns that restaurant meals can be sabotaging because they can be so high in calories, fat, sodium, and/or sugar. Patient learns recommended strategies on how to positively address this and avoid unhealthy pitfalls.  Facts on Fats  Clinical staff conducted group or individual video education with verbal and written material and guidebook.  Patient learns that lifestyle modifications can be just as effective, if not more so, as many medications for lowering your risk of heart disease. A Pritikin lifestyle can help to reduce your risk of inflammation and atherosclerosis (cholesterol build-up, or plaque, in the artery walls). Lifestyle interventions such as dietary choices and physical activity address the cause of atherosclerosis. A review of the types of fats and their impact on blood cholesterol levels, along with dietary recommendations to reduce fat intake is also included.  Nutrition Action Plan  Clinical staff conducted group or individual video education with verbal and written material and guidebook.  Patient learns how to incorporate Pritikin recommendations into their lifestyle. Recommendations include planning and keeping personal health goals in mind as an important part of their success.  Healthy Mind-Set    Healthy Minds, Bodies, Hearts  Clinical staff conducted group or individual video education with verbal and written material and guidebook.  Patient learns how to identify  when they are stressed. Video will discuss the impact of that stress, as well as the many benefits of stress management. Patient will also be introduced to stress management techniques. The way we think, act, and feel has an impact on our hearts.  How Our Thoughts Can Heal Our Hearts  Clinical staff conducted group or individual video education with verbal and written material and guidebook.  Patient learns that negative thoughts can cause depression and anxiety. This can result in negative lifestyle behavior and serious health problems. Cognitive behavioral therapy is an effective method to help control our thoughts in order to change and improve our emotional outlook.  Additional Videos:  Exercise    Improving Performance  Clinical staff conducted group or individual video education with verbal and written material and guidebook.  Patient learns to use a non-linear approach by alternating intensity levels and lengths of time spent exercising to help burn more calories and lose more body fat. Cardiovascular exercise helps improve heart health, metabolism, hormonal balance, blood sugar control, and recovery from fatigue. Resistance training improves strength, endurance, balance, coordination, reaction time, metabolism, and muscle mass. Flexibility exercise improves circulation, posture, and balance. Seek guidance from your physician and exercise physiologist before implementing an exercise routine and learn your capabilities and proper form for all exercise.  Introduction to Yoga  Clinical staff conducted group or individual video education with verbal and written material and guidebook.  Patient learns about yoga, a discipline of the coming together of mind, breath, and body. The benefits of yoga include improved flexibility, improved range of motion, better posture and core strength, increased lung function, weight loss, and positive self-image. Yoga's heart health benefits include lowered blood  pressure, healthier heart rate, decreased cholesterol and triglyceride levels, improved immune function, and reduced stress. Seek guidance from your physician and exercise physiologist before implementing an exercise routine and learn your capabilities and proper form for all exercise.  Medical   Aging: Enhancing Your Quality of Life  Clinical staff conducted group or individual video education with verbal and written material and guidebook.  Patient learns key strategies and recommendations to stay in good physical health and enhance quality of life, such as prevention strategies, having an advocate, securing a Health Care Proxy and Power of Attorney, and keeping a list of medications and system for tracking them. It also discusses how to avoid risk for bone loss.  Biology of Weight Control  Clinical staff conducted group or individual video education with verbal and written material and guidebook.  Patient learns that weight gain occurs because we consume more calories than we burn (eating more, moving less). Even if your body weight is normal, you may have higher ratios of fat compared to muscle mass. Too much body fat puts you at increased risk for cardiovascular disease, heart attack, stroke, type 2 diabetes, and obesity-related cancers. In addition to exercise, following the Pritikin Eating Plan can help reduce your risk.  Decoding Lab Results  Clinical staff conducted group or individual video education with verbal and written material and guidebook.  Patient learns that lab test reflects one measurement whose values change over time and are influenced by many factors, including medication, stress, sleep, exercise, food, hydration, pre-existing medical conditions, and more. It is recommended to use the knowledge from this video to become more involved with your lab results and evaluate your numbers to  speak with your doctor.   Diseases of Our Time - Overview  Clinical staff conducted group or  individual video education with verbal and written material and guidebook.  Patient learns that according to the CDC, 50% to 70% of chronic diseases (such as obesity, type 2 diabetes, elevated lipids, hypertension, and heart disease) are avoidable through lifestyle improvements including healthier food choices, listening to satiety cues, and increased physical activity.  Sleep Disorders Clinical staff conducted group or individual video education with verbal and written material and guidebook.  Patient learns how good quality and duration of sleep are important to overall health and well-being. Patient also learns about sleep disorders and how they impact health along with recommendations to address them, including discussing with a physician.  Nutrition  Dining Out - Part 2 Clinical staff conducted group or individual video education with verbal and written material and guidebook.  Patient learns how to plan ahead and communicate in order to maximize their dining experience in a healthy and nutritious manner. Included are recommended food choices based on the type of restaurant the patient is visiting.   Fueling a Banker conducted group or individual video education with verbal and written material and guidebook.  There is a strong connection between our food choices and our health. Diseases like obesity and type 2 diabetes are very prevalent and are in large-part due to lifestyle choices. The Pritikin Eating Plan provides plenty of food and hunger-curbing satisfaction. It is easy to follow, affordable, and helps reduce health risks.  Menu Workshop  Clinical staff conducted group or individual video education with verbal and written material and guidebook.  Patient learns that restaurant meals can sabotage health goals because they are often packed with calories, fat, sodium, and sugar. Recommendations include strategies to plan ahead and to communicate with the manager,  chef, or server to help order a healthier meal.  Planning Your Eating Strategy  Clinical staff conducted group or individual video education with verbal and written material and guidebook.  Patient learns about the Pritikin Eating Plan and its benefit of reducing the risk of disease. The Pritikin Eating Plan does not focus on calories. Instead, it emphasizes high-quality, nutrient-rich foods. By knowing the characteristics of the foods, we choose, we can determine their calorie density and make informed decisions.  Targeting Your Nutrition Priorities  Clinical staff conducted group or individual video education with verbal and written material and guidebook.  Patient learns that lifestyle habits have a tremendous impact on disease risk and progression. This video provides eating and physical activity recommendations based on your personal health goals, such as reducing LDL cholesterol, losing weight, preventing or controlling type 2 diabetes, and reducing high blood pressure.  Vitamins and Minerals  Clinical staff conducted group or individual video education with verbal and written material and guidebook.  Patient learns different ways to obtain key vitamins and minerals, including through a recommended healthy diet. It is important to discuss all supplements you take with your doctor.   Healthy Mind-Set    Smoking Cessation  Clinical staff conducted group or individual video education with verbal and written material and guidebook.  Patient learns that cigarette smoking and tobacco addiction pose a serious health risk which affects millions of people. Stopping smoking will significantly reduce the risk of heart disease, lung disease, and many forms of cancer. Recommended strategies for quitting are covered, including working with your doctor to develop a successful plan.  Culinary   Becoming a Neurosurgeon  Clinical staff conducted group or individual video education with verbal and written  material and guidebook.  Patient learns that cooking at home can be healthy, cost-effective, quick, and puts them in control. Keys to cooking healthy recipes will include looking at your recipe, assessing your equipment needs, planning ahead, making it simple, choosing cost-effective seasonal ingredients, and limiting the use of added fats, salts, and sugars.  Cooking - Breakfast and Snacks  Clinical staff conducted group or individual video education with verbal and written material and guidebook.  Patient learns how important breakfast is to satiety and nutrition through the entire day. Recommendations include key foods to eat during breakfast to help stabilize blood sugar levels and to prevent overeating at meals later in the day. Planning ahead is also a key component.  Cooking - Educational psychologist conducted group or individual video education with verbal and written material and guidebook.  Patient learns eating strategies to improve overall health, including an approach to cook more at home. Recommendations include thinking of animal protein as a side on your plate rather than center stage and focusing instead on lower calorie dense options like vegetables, fruits, whole grains, and plant-based proteins, such as beans. Making sauces in large quantities to freeze for later and leaving the skin on your vegetables are also recommended to maximize your experience.  Cooking - Healthy Salads and Dressing Clinical staff conducted group or individual video education with verbal and written material and guidebook.  Patient learns that vegetables, fruits, whole grains, and legumes are the foundations of the Pritikin Eating Plan. Recommendations include how to incorporate each of these in flavorful and healthy salads, and how to create homemade salad dressings. Proper handling of ingredients is also covered. Cooking - Soups and [employer] - Soups and Desserts Clinical staff conducted  group or individual video education with verbal and written material and guidebook.  Patient learns that Pritikin soups and desserts make for easy, nutritious, and delicious snacks and meal components that are low in sodium, fat, sugar, and calorie density, while high in vitamins, minerals, and filling fiber. Recommendations include simple and healthy ideas for soups and desserts.   Overview     The Pritikin Solution Program Overview Clinical staff conducted group or individual video education with verbal and written material and guidebook.  Patient learns that the results of the Pritikin Program have been documented in more than 100 articles published in peer-reviewed journals, and the benefits include reducing risk factors for (and, in some cases, even reversing) high cholesterol, high blood pressure, type 2 diabetes, obesity, and more! An overview of the three key pillars of the Pritikin Program will be covered: eating well, doing regular exercise, and having a healthy mind-set.  WORKSHOPS  Exercise: Exercise Basics: Building Your Action Plan Clinical staff led group instruction and group discussion with PowerPoint presentation and patient guidebook. To enhance the learning environment the use of posters, models and videos may be added. At the conclusion of this workshop, patients will comprehend the difference between physical activity and exercise, as well as the benefits of incorporating both, into their routine. Patients will understand the FITT (Frequency, Intensity, Time, and Type) principle and how to use it to build an exercise action plan. In addition, safety concerns and other considerations for exercise and cardiac rehab will be addressed by the presenter. The purpose of this lesson is to promote a comprehensive and effective weekly exercise routine in order to improve patients' overall level of fitness.  Managing Heart Disease: Your Path to a Healthier Heart Clinical staff led  group instruction and group discussion with PowerPoint presentation and patient guidebook. To enhance the learning environment the use of posters, models and videos may be added.At the conclusion of this workshop, patients will understand the anatomy and physiology of the heart. Additionally, they will understand how Pritikin's three pillars impact the risk factors, the progression, and the management of heart disease.  The purpose of this lesson is to provide a high-level overview of the heart, heart disease, and how the Pritikin lifestyle positively impacts risk factors.  Exercise Biomechanics Clinical staff led group instruction and group discussion with PowerPoint presentation and patient guidebook. To enhance the learning environment the use of posters, models and videos may be added. Patients will learn how the structural parts of their bodies function and how these functions impact their daily activities, movement, and exercise. Patients will learn how to promote a neutral spine, learn how to manage pain, and identify ways to improve their physical movement in order to promote healthy living. The purpose of this lesson is to expose patients to common physical limitations that impact physical activity. Participants will learn practical ways to adapt and manage aches and pains, and to minimize their effect on regular exercise. Patients will learn how to maintain good posture while sitting, walking, and lifting.  Balance Training and Fall Prevention  Clinical staff led group instruction and group discussion with PowerPoint presentation and patient guidebook. To enhance the learning environment the use of posters, models and videos may be added. At the conclusion of this workshop, patients will understand the importance of their sensorimotor skills (vision, proprioception, and the vestibular system) in maintaining their ability to balance as they age. Patients will apply a variety of balancing  exercises that are appropriate for their current level of function. Patients will understand the common causes for poor balance, possible solutions to these problems, and ways to modify their physical environment in order to minimize their fall risk. The purpose of this lesson is to teach patients about the importance of maintaining balance as they age and ways to minimize their risk of falling.  WORKSHOPS   Nutrition:  Fueling a Ship broker led group instruction and group discussion with PowerPoint presentation and patient guidebook. To enhance the learning environment the use of posters, models and videos may be added. Patients will review the foundational principles of the Pritikin Eating Plan and understand what constitutes a serving size in each of the food groups. Patients will also learn Pritikin-friendly foods that are better choices when away from home and review make-ahead meal and snack options. Calorie density will be reviewed and applied to three nutrition priorities: weight maintenance, weight loss, and weight gain. The purpose of this lesson is to reinforce (in a group setting) the key concepts around what patients are recommended to eat and how to apply these guidelines when away from home by planning and selecting Pritikin-friendly options. Patients will understand how calorie density may be adjusted for different weight management goals.  Mindful Eating  Clinical staff led group instruction and group discussion with PowerPoint presentation and patient guidebook. To enhance the learning environment the use of posters, models and videos may be added. Patients will briefly review the concepts of the Pritikin Eating Plan and the importance of low-calorie dense foods. The concept of mindful eating will be introduced as well as the importance of paying attention to internal hunger signals. Triggers for non-hunger eating and  techniques for dealing with triggers will be explored.  The purpose of this lesson is to provide patients with the opportunity to review the basic principles of the Pritikin Eating Plan, discuss the value of eating mindfully and how to measure internal cues of hunger and fullness using the Hunger Scale. Patients will also discuss reasons for non-hunger eating and learn strategies to use for controlling emotional eating.  Targeting Your Nutrition Priorities Clinical staff led group instruction and group discussion with PowerPoint presentation and patient guidebook. To enhance the learning environment the use of posters, models and videos may be added. Patients will learn how to determine their genetic susceptibility to disease by reviewing their family history. Patients will gain insight into the importance of diet as part of an overall healthy lifestyle in mitigating the impact of genetics and other environmental insults. The purpose of this lesson is to provide patients with the opportunity to assess their personal nutrition priorities by looking at their family history, their own health history and current risk factors. Patients will also be able to discuss ways of prioritizing and modifying the Pritikin Eating Plan for their highest risk areas  Menu  Clinical staff led group instruction and group discussion with PowerPoint presentation and patient guidebook. To enhance the learning environment the use of posters, models and videos may be added. Using menus brought in from [employer], or printed from [employer], patients will apply the Pritikin dining out guidelines that were presented in the [employer] video. Patients will also be able to practice these guidelines in a variety of provided scenarios. The purpose of this lesson is to provide patients with the opportunity to practice hands-on learning of the Pritikin Dining Out guidelines with actual menus and practice scenarios.  Label Reading Clinical staff led group instruction  and group discussion with PowerPoint presentation and patient guidebook. To enhance the learning environment the use of posters, models and videos may be added. Patients will review and discuss the Pritikin label reading guidelines presented in Pritikin's Label Reading Educational series video. Using fool labels brought in from local grocery stores and markets, patients will apply the label reading guidelines and determine if the packaged food meet the Pritikin guidelines. The purpose of this lesson is to provide patients with the opportunity to review, discuss, and practice hands-on learning of the Pritikin Label Reading guidelines with actual packaged food labels. Cooking School  Pritikin's [employer] are designed to teach patients ways to prepare quick, simple, and affordable recipes at home. The importance of nutrition's role in chronic disease risk reduction is reflected in its emphasis in the overall Pritikin program. By learning how to prepare essential core Pritikin Eating Plan recipes, patients will increase control over what they eat; be able to customize the flavor of foods without the use of added salt, sugar, or fat; and improve the quality of the food they consume. By learning a set of core recipes which are easily assembled, quickly prepared, and affordable, patients are more likely to prepare more healthy foods at home. These workshops focus on convenient breakfasts, simple entres, side dishes, and desserts which can be prepared with minimal effort and are consistent with nutrition recommendations for cardiovascular risk reduction. Cooking [employer] are taught by a Armed forces logistics/support/administrative officer (RD) who has been trained by the [employer]. The chef or RD has a clear understanding of the importance of minimizing - if not completely eliminating - added fat, sugar, and sodium in  recipes. Throughout the series of Cooking School Workshop sessions, patients will learn  about healthy ingredients and efficient methods of cooking to build confidence in their capability to prepare    Cooking School weekly topics:  Adding Flavor- Sodium-Free  Fast and Healthy Breakfasts  Powerhouse Plant-Based Proteins  Satisfying Salads and Dressings  Simple Sides and Sauces  International Cuisine-Spotlight on the [employer] Zones  Delicious Desserts  Savory Soups  [employer] - Meals in a Astronomer Appetizers and Snacks  Comforting Weekend Breakfasts  One-Pot Wonders   Fast Evening Meals  Landscape architect Your Pritikin Plate  WORKSHOPS   Healthy Mindset (Psychosocial):  Focused Goals, Sustainable Changes Clinical staff led group instruction and group discussion with PowerPoint presentation and patient guidebook. To enhance the learning environment the use of posters, models and videos may be added. Patients will be able to apply effective goal setting strategies to establish at least one personal goal, and then take consistent, meaningful action toward that goal. They will learn to identify common barriers to achieving personal goals and develop strategies to overcome them. Patients will also gain an understanding of how our mind-set can impact our ability to achieve goals and the importance of cultivating a positive and growth-oriented mind-set. The purpose of this lesson is to provide patients with a deeper understanding of how to set and achieve personal goals, as well as the tools and strategies needed to overcome common obstacles which may arise along the way.  From Head to Heart: The Power of a Healthy Outlook  Clinical staff led group instruction and group discussion with PowerPoint presentation and patient guidebook. To enhance the learning environment the use of posters, models and videos may be added. Patients will be able to recognize and describe the impact of emotions and mood on physical health. They will discover the importance of  self-care and explore self-care practices which may work for them. Patients will also learn how to utilize the 4 C's to cultivate a healthier outlook and better manage stress and challenges. The purpose of this lesson is to demonstrate to patients how a healthy outlook is an essential part of maintaining good health, especially as they continue their cardiac rehab journey.  Healthy Sleep for a Healthy Heart Clinical staff led group instruction and group discussion with PowerPoint presentation and patient guidebook. To enhance the learning environment the use of posters, models and videos may be added. At the conclusion of this workshop, patients will be able to demonstrate knowledge of the importance of sleep to overall health, well-being, and quality of life. They will understand the symptoms of, and treatments for, common sleep disorders. Patients will also be able to identify daytime and nighttime behaviors which impact sleep, and they will be able to apply these tools to help manage sleep-related challenges. The purpose of this lesson is to provide patients with a general overview of sleep and outline the importance of quality sleep. Patients will learn about a few of the most common sleep disorders. Patients will also be introduced to the concept of "sleep hygiene," and discover ways to self-manage certain sleeping problems through simple daily behavior changes. Finally, the workshop will motivate patients by clarifying the links between quality sleep and their goals of heart-healthy living.   Recognizing and Reducing Stress Clinical staff led group instruction and group discussion with PowerPoint presentation and patient guidebook. To enhance the learning environment the use of posters, models and videos may be added. At the conclusion of this  workshop, patients will be able to understand the types of stress reactions, differentiate between acute and chronic stress, and recognize the impact that chronic  stress has on their health. They will also be able to apply different coping mechanisms, such as reframing negative self-talk. Patients will have the opportunity to practice a variety of stress management techniques, such as deep abdominal breathing, progressive muscle relaxation, and/or guided imagery.  The purpose of this lesson is to educate patients on the role of stress in their lives and to provide healthy techniques for coping with it.  Learning Barriers/Preferences:  Learning Barriers/Preferences - 09/05/22 0951       Learning Barriers/Preferences   Learning Barriers None    Learning Preferences Video;Skilled Demonstration             Education Topics:  Knowledge Questionnaire Score:  Knowledge Questionnaire Score - 09/05/22 1004       Knowledge Questionnaire Score   Pre Score 18/24             Core Components/Risk Factors/Patient Goals at Admission:  Personal Goals and Risk Factors at Admission - 09/05/22 1011       Core Components/Risk Factors/Patient Goals on Admission    Weight Management Yes;Obesity;Weight Loss    Intervention Weight Management/Obesity: Establish reasonable short term and long term weight goals.;Obesity: Provide education and appropriate resources to help participant work on and attain dietary goals.    Admit Weight 227 lb 1.2 oz (103 kg)    Expected Outcomes Short Term: Continue to assess and modify interventions until short term weight is achieved;Long Term: Adherence to nutrition and physical activity/exercise program aimed toward attainment of established weight goal;Weight Loss: Understanding of general recommendations for a balanced deficit meal plan, which promotes 1-2 lb weight loss per week and includes a negative energy balance of [phone] kcal/d    Hypertension Yes    Intervention Provide education on lifestyle modifcations including regular physical activity/exercise, weight management, moderate sodium restriction and increased  consumption of fresh fruit, vegetables, and low fat dairy, alcohol moderation, and smoking cessation.;Monitor prescription use compliance.    Expected Outcomes Short Term: Continued assessment and intervention until BP is < 140/63mm HG in hypertensive participants. < 130/68mm HG in hypertensive participants with diabetes, heart failure or chronic kidney disease.;Long Term: Maintenance of blood pressure at goal levels.    Lipids Yes    Intervention Provide education and support for participant on nutrition & aerobic/resistive exercise along with prescribed medications to achieve LDL 70mg , HDL >40mg .    Expected Outcomes Short Term: Participant states understanding of desired cholesterol values and is compliant with medications prescribed. Participant is following exercise prescription and nutrition guidelines.;Long Term: Cholesterol controlled with medications as prescribed, with individualized exercise RX and with personalized nutrition plan. Value goals: LDL < 70mg , HDL > 40 mg.             Core Components/Risk Factors/Patient Goals Review:   Goals and Risk Factor Review     Row Name 10/10/22 0845 11/03/22 1610           Core Components/Risk Factors/Patient Goals Review   Personal Goals Review Weight Management/Obesity;Hypertension;Lipids Weight Management/Obesity;Hypertension;Lipids      Review Ula has been doing well with exercise at cardiac rehab. vital signs have been stable. Gene's last day of attendance was on 09/27/22 as Damien is currently out of town Cheo has been doing well with exercise at cardiac rehab. vital signs have been stable. Kahleel has lost 4.7 kg since starting the program.  Janice's llipid levels have improved. Mehdi will complete cardiac rehab on 11/17/22      Expected Outcomes Gemari will continue to participate in cardiac rehab for exercise, nutrition and lifestyle modifications Michail will continue to participate in cardiac rehab for exercise,  nutrition and lifestyle modifications               Core Components/Risk Factors/Patient Goals at Discharge (Final Review):   Goals and Risk Factor Review - 11/03/22 0928       Core Components/Risk Factors/Patient Goals Review   Personal Goals Review Weight Management/Obesity;Hypertension;Lipids    Review Kanya has been doing well with exercise at cardiac rehab. vital signs have been stable. Cainan has lost 4.7 kg since starting the program. Miciah's llipid levels have improved. Jaykon will complete cardiac rehab on 11/17/22    Expected Outcomes Vardan will continue to participate in cardiac rehab for exercise, nutrition and lifestyle modifications             ITP Comments:  ITP Comments     Row Name 09/05/22 0800 09/11/22 1327 10/10/22 0843 11/03/22 0923     ITP Comments Medical Director- Dr. Armanda Magic, MD. Introduction to the Pritikin Education Program / Intensive Cardiac Rehab. Reviewed orientation folder with patient. 30- day ITP review. Gershom started cardiac rehab on 09/11/2022 and did well with exercise. Dr. Armanda Magic is medical director. 30 Day ITP Review. Jarman has good participaiton when in attendance at cardiac rehab. Oumar is currently out of town 30 Day ITP Review. Samisoni has good participaiton when in attendance at cardiac rehab. Lindwood will complete cardiac rehab on 11/17/22             Comments: See ITP comments.Thayer Headings RN BSN

## 2022-11-06 ENCOUNTER — Encounter (HOSPITAL_COMMUNITY)
Admission: RE | Admit: 2022-11-06 | Discharge: 2022-11-06 | Disposition: A | Discharge status: Home / Self Care | Payer: 59 | Source: Ambulatory Visit | Attending: Cardiology | Admitting: Cardiology

## 2022-11-06 DIAGNOSIS — Z955 Presence of coronary angioplasty implant and graft: Secondary | ICD-10-CM | POA: Diagnosis not present

## 2022-11-08 ENCOUNTER — Encounter (HOSPITAL_COMMUNITY)
Admission: RE | Admit: 2022-11-08 | Discharge: 2022-11-08 | Disposition: A | Discharge status: Home / Self Care | Payer: 59 | Source: Ambulatory Visit | Attending: Cardiology | Admitting: Cardiology

## 2022-11-08 DIAGNOSIS — Z955 Presence of coronary angioplasty implant and graft: Secondary | ICD-10-CM | POA: Diagnosis not present

## 2022-11-10 ENCOUNTER — Encounter (HOSPITAL_COMMUNITY)
Admission: RE | Admit: 2022-11-10 | Discharge: 2022-11-10 | Disposition: A | Discharge status: Home / Self Care | Payer: 59 | Source: Ambulatory Visit | Attending: Cardiology | Admitting: Cardiology

## 2022-11-10 DIAGNOSIS — Z955 Presence of coronary angioplasty implant and graft: Secondary | ICD-10-CM | POA: Diagnosis not present

## 2022-11-15 ENCOUNTER — Encounter (HOSPITAL_COMMUNITY)
Admission: RE | Admit: 2022-11-15 | Discharge: 2022-11-15 | Disposition: A | Discharge status: Home / Self Care | Payer: 59 | Source: Ambulatory Visit | Attending: Cardiology | Admitting: Cardiology

## 2022-11-15 DIAGNOSIS — Z955 Presence of coronary angioplasty implant and graft: Secondary | ICD-10-CM | POA: Insufficient documentation

## 2022-11-15 DIAGNOSIS — I2510 Atherosclerotic heart disease of native coronary artery without angina pectoris: Secondary | ICD-10-CM | POA: Insufficient documentation

## 2022-11-15 DIAGNOSIS — Z48812 Encounter for surgical aftercare following surgery on the circulatory system: Secondary | ICD-10-CM | POA: Insufficient documentation

## 2022-11-17 ENCOUNTER — Encounter (HOSPITAL_COMMUNITY)
Admission: RE | Admit: 2022-11-17 | Discharge: 2022-11-17 | Disposition: A | Discharge status: Home / Self Care | Payer: 59 | Source: Ambulatory Visit | Attending: Cardiology | Admitting: Cardiology

## 2022-11-17 DIAGNOSIS — Z955 Presence of coronary angioplasty implant and graft: Secondary | ICD-10-CM

## 2022-11-20 ENCOUNTER — Encounter (HOSPITAL_COMMUNITY): Payer: 59

## 2022-11-22 ENCOUNTER — Encounter (HOSPITAL_COMMUNITY)
Admission: RE | Admit: 2022-11-22 | Discharge: 2022-11-22 | Disposition: A | Discharge status: Home / Self Care | Payer: 59 | Source: Ambulatory Visit | Attending: Cardiology | Admitting: Cardiology

## 2022-11-22 DIAGNOSIS — Z955 Presence of coronary angioplasty implant and graft: Secondary | ICD-10-CM | POA: Diagnosis not present

## 2022-11-24 ENCOUNTER — Encounter (HOSPITAL_COMMUNITY)
Admission: RE | Admit: 2022-11-24 | Discharge: 2022-11-24 | Disposition: A | Discharge status: Home / Self Care | Payer: 59 | Source: Ambulatory Visit | Attending: Cardiology | Admitting: Cardiology

## 2022-11-24 DIAGNOSIS — Z955 Presence of coronary angioplasty implant and graft: Secondary | ICD-10-CM

## 2022-11-27 ENCOUNTER — Encounter (HOSPITAL_COMMUNITY)
Admission: RE | Admit: 2022-11-27 | Discharge: 2022-11-27 | Disposition: A | Discharge status: Home / Self Care | Payer: 59 | Source: Ambulatory Visit | Attending: Cardiology | Admitting: Cardiology

## 2022-11-27 DIAGNOSIS — Z955 Presence of coronary angioplasty implant and graft: Secondary | ICD-10-CM

## 2022-11-29 ENCOUNTER — Encounter (HOSPITAL_COMMUNITY)
Admission: RE | Admit: 2022-11-29 | Discharge: 2022-11-29 | Disposition: A | Discharge status: Home / Self Care | Payer: 59 | Source: Ambulatory Visit | Attending: Cardiology | Admitting: Cardiology

## 2022-11-29 DIAGNOSIS — Z955 Presence of coronary angioplasty implant and graft: Secondary | ICD-10-CM | POA: Diagnosis not present

## 2022-12-01 ENCOUNTER — Encounter (HOSPITAL_COMMUNITY)
Admission: RE | Admit: 2022-12-01 | Discharge: 2022-12-01 | Disposition: A | Discharge status: Home / Self Care | Payer: 59 | Source: Ambulatory Visit | Attending: Cardiology | Admitting: Cardiology

## 2022-12-01 DIAGNOSIS — Z955 Presence of coronary angioplasty implant and graft: Secondary | ICD-10-CM

## 2022-12-04 ENCOUNTER — Encounter (HOSPITAL_COMMUNITY)
Admission: RE | Admit: 2022-12-04 | Discharge: 2022-12-04 | Disposition: A | Discharge status: Home / Self Care | Payer: 59 | Source: Ambulatory Visit | Attending: Cardiology | Admitting: Cardiology

## 2022-12-04 DIAGNOSIS — Z955 Presence of coronary angioplasty implant and graft: Secondary | ICD-10-CM | POA: Diagnosis not present

## 2022-12-05 NOTE — Progress Notes (Signed)
Cardiac Individual Treatment Plan  Patient Details  Name: Brian Elliott MRN: [id number] Date of Birth: 09-04-1965 Referring Provider:   Flowsheet Row INTENSIVE CARDIAC REHAB ORIENT from 09/05/2022 in [facility]  Referring Provider Tessa Lerner, DO       Initial Encounter Date:  Flowsheet Row INTENSIVE CARDIAC REHAB ORIENT from 09/05/2022 in [facility]  Date 09/05/22       Visit Diagnosis: 08/04/22 DES mLAD, pLAD  Patient's Home Medications on Admission:  Current Outpatient Medications:    acetaminophen (TYLENOL) 500 MG tablet, Take 500 mg by mouth every 6 (six) hours as needed for moderate pain., Disp: , Rfl:    atorvastatin (LIPITOR) 40 MG tablet, Take 1 tablet (40 mg total) by mouth daily., Disp: 30 tablet, Rfl: 3   [employer] ASPIRIN LOW DOSE 81 MG tablet, Take 81 mg by mouth daily., Disp: , Rfl:    ezetimibe (ZETIA) 10 MG tablet, Take 1 tablet (10 mg total) by mouth daily., Disp: 90 tablet, Rfl: 3   famotidine (PEPCID) 20 MG tablet, Take 20 mg by mouth daily., Disp: , Rfl:    fexofenadine (ALLEGRA) 180 MG tablet, Take 180 mg by mouth daily., Disp: , Rfl:    isosorbide mononitrate (IMDUR) 30 MG 24 hr tablet, Take 1 tablet (30 mg total) by mouth at bedtime., Disp: 90 tablet, Rfl: 3   levothyroxine (SYNTHROID) 150 MCG tablet, Take 150 mcg by mouth daily., Disp: , Rfl:    losartan (COZAAR) 25 MG tablet, Take 0.5 tablets (12.5 mg total) by mouth at bedtime., Disp: 90 tablet, Rfl: 3   MAGNESIUM PO, Take 200 mg by mouth in the morning and at bedtime., Disp: , Rfl:    metoprolol succinate (TOPROL-XL) 25 MG 24 hr tablet, Take 1 tablet (25 mg total) by mouth daily. Take with or immediately following a meal., Disp: 30 tablet, Rfl: 3   Multiple Vitamin (MULTIVITAMIN) tablet, Take 1 tablet by mouth daily., Disp: , Rfl:    nitroGLYCERIN (NITROSTAT) 0.4 MG SL tablet, Place 0.4 mg  under the tongue every 5 (five) minutes as needed for chest pain., Disp: , Rfl:    Omega-3 Fatty Acids (FISH OIL PO), Take 1 capsule by mouth daily., Disp: , Rfl:    sertraline (ZOLOFT) 25 MG tablet, Take 1 tablet (25 mg total) by mouth daily., Disp: 90 tablet, Rfl: 3   ticagrelor (BRILINTA) 90 MG TABS tablet, Take 1 tablet (90 mg total) by mouth 2 (two) times daily., Disp: 60 tablet, Rfl: 3   vitamin E 180 MG (400 UNITS) capsule, Take 400 Units by mouth daily., Disp: , Rfl:    zinc gluconate 50 MG tablet, Take 50 mg by mouth in the morning and at bedtime., Disp: , Rfl:   Past Medical History: Past Medical History:  Diagnosis Date   Allergy    seasonal   Coronary artery disease    GERD (gastroesophageal reflux disease)    Hyperlipidemia    Hypothyroid    Sleep apnea    wears cpap    Tobacco Use: Social History   Tobacco Use  Smoking Status Former   Current packs/day: 0.00   Average packs/day: 2.0 packs/day for 15.0 years (30.0 ttl pk-yrs)   Types: Cigarettes, Cigars   Start date: 03/13/1978   Quit date: 03/13/1993   Years since quitting: 29.7  Smokeless Tobacco Never  Tobacco Comments   occ Cigars  Labs: Review Flowsheet       Latest Ref Rng & Units 08/03/2022 08/04/2022 10/11/2022  Labs for ITP Cardiac and Pulmonary Rehab  Cholestrol 100 - 199 mg/dL - 191  478   LDL (calc) 0 - 99 mg/dL - 90  56   Direct LDL 0 - 99 mg/dL - 97  54   HDL-C >29 mg/dL - 36  35   Trlycerides 0 - 149 mg/dL - 562  130   Hemoglobin A1c 4.8 - 5.6 % 5.9  - -    Details            Capillary Blood Glucose: No results found for: "GLUCAP"   Exercise Target Goals: Exercise Program Goal: Individual exercise prescription set using results from initial 6 min walk test and THRR while considering  patient's activity barriers and safety.   Exercise Prescription Goal: Initial exercise prescription builds to 30-45 minutes a day of aerobic activity, 2-3 days per week.  Home exercise guidelines  will be given to patient during program as part of exercise prescription that the participant will acknowledge.  Activity Barriers & Risk Stratification:  Activity Barriers & Cardiac Risk Stratification - 09/05/22 0907       Activity Barriers & Cardiac Risk Stratification   Activity Barriers Other (comment)    Comments Left knee bothers him occassionally.    Cardiac Risk Stratification Low             6 Minute Walk:  6 Minute Walk     Row Name 09/05/22 0917         6 Minute Walk   Phase Initial     Distance 1872 feet     Walk Time 6 minutes     # of Rest Breaks 0     MPH 3.54     METS 4.12     RPE 9     Perceived Dyspnea  0     VO2 Peak 14.43     Symptoms No     Resting HR 81 bpm     Resting BP 108/70     Resting Oxygen Saturation  97 %     Exercise Oxygen Saturation  during 6 min walk 98 %     Max Ex. HR 100 bpm     Max Ex. BP 110/64     2 Minute Post BP 108/72              Oxygen Initial Assessment:   Oxygen Re-Evaluation:   Oxygen Discharge (Final Oxygen Re-Evaluation):   Initial Exercise Prescription:  Initial Exercise Prescription - 09/05/22 1000       Date of Initial Exercise RX and Referring Provider   Date 09/05/22    Referring Provider Tessa Lerner, DO    Expected Discharge Date 11/17/22      Bike   Level 2    Watts 50    METs 3.8      NuStep   Level 3    SPM 85    Minutes 15    METs 3.2      Prescription Details   Frequency (times per week) 3    Duration Progress to 30 minutes of continuous aerobic without signs/symptoms of physical distress      Intensity   THRR 40-80% of Max Heartrate 65-130    Ratings of Perceived Exertion 11-13    Perceived Dyspnea 0-4      Progression   Progression Continue to progress workloads to maintain intensity without signs/symptoms  of physical distress.      Resistance Training   Training Prescription Yes    Weight 5 lbs    Reps 10-15             Perform Capillary Blood Glucose  checks as needed.  Exercise Prescription Changes:   Exercise Prescription Changes     Row Name 09/11/22 0800 09/27/22 0825 10/13/22 0834 10/25/22 0823 11/15/22 0825     Response to Exercise   Blood Pressure (Admit) 112/72 110/76 96/61 96/61  114/62   Blood Pressure (Exercise) 130/80 124/80 138/70 138/70 128/72   Blood Pressure (Exit) 118/70 100/70 95/63 95/63  94/64   Heart Rate (Admit) 76 bpm 62 bpm 66 bpm 66 bpm 73 bpm   Heart Rate (Exercise) 100 bpm 102 bpm 116 bpm 116 bpm 120 bpm   Heart Rate (Exit) 75 bpm 71 bpm 73 bpm 73 bpm 68 bpm   Rating of Perceived Exertion (Exercise) 8 11.5 11.5 11.5 11   Perceived Dyspnea (Exercise) 0 0 0 0 0   Symptoms 0 0 0 0 0   Comments Pt first day in teh [facility] ICR program REVD MET's, goals and home ExRx REVD MET's REVD MET's and goals REVD MET's   Duration Progress to 30 minutes of  aerobic without signs/symptoms of physical distress Progress to 30 minutes of  aerobic without signs/symptoms of physical distress Progress to 30 minutes of  aerobic without signs/symptoms of physical distress Progress to 30 minutes of  aerobic without signs/symptoms of physical distress Progress to 30 minutes of  aerobic without signs/symptoms of physical distress   Intensity THRR unchanged THRR unchanged THRR unchanged THRR unchanged THRR unchanged     Progression   Progression Continue to progress workloads to maintain intensity without signs/symptoms of physical distress. Continue to progress workloads to maintain intensity without signs/symptoms of physical distress. Continue to progress workloads to maintain intensity without signs/symptoms of physical distress. Continue to progress workloads to maintain intensity without signs/symptoms of physical distress. Continue to progress workloads to maintain intensity without signs/symptoms of physical distress.   Average METs 2.5 3.35 4 4 4.93     Resistance Training   Training Prescription Yes No Yes No No   Weight 5 -- 5  lbs wts -- --   Reps 10-15 -- 10-15 -- --   Time 10 Minutes -- 10 Minutes -- --     Treadmill   MPH -- -- -- -- 3.8   Grade -- -- -- -- 2   Minutes -- -- -- -- 15   METs -- -- -- -- 4.96     Bike   Level 2 3 3  3.7 --   Watts 29 45 55 75 --   Minutes 15 15 15 15  --   METs 2.7 3.4 3.8 4.4 --     NuStep   Level 3 4 4 4 5    SPM -- 114 123 -- 124   Minutes 15 15 15 15 15    METs 2.3 3.3 4.2 4.4 4.6     Home Exercise Plan   Plans to continue exercise at -- Home (comment) Home (comment) Home (comment) Home (comment)   Frequency -- Add 4 additional days to program exercise sessions. Add 4 additional days to program exercise sessions. Add 4 additional days to program exercise sessions. Add 4 additional days to program exercise sessions.   Initial Home Exercises Provided -- 09/27/22 09/27/22 09/27/22 09/27/22    Row Name 12/06/22 [phone]  Response to Exercise   Blood Pressure (Admit) 98/56       Blood Pressure (Exercise) 142/60       Blood Pressure (Exit) 98/68       Heart Rate (Admit) 62 bpm       Heart Rate (Exercise) 127 bpm       Heart Rate (Exit) 69 bpm       Rating of Perceived Exertion (Exercise) 11.5       Perceived Dyspnea (Exercise) 0       Symptoms 0       Comments REVD MET's and goals       Duration Progress to 30 minutes of  aerobic without signs/symptoms of physical distress       Intensity THRR unchanged         Progression   Progression Continue to progress workloads to maintain intensity without signs/symptoms of physical distress.       Average METs 5.54         Resistance Training   Training Prescription No         Treadmill   MPH 3.8       Grade 3       Minutes 15       METs 5.48         NuStep   Level 6       SPM 128       Minutes 15       METs 5.6         Home Exercise Plan   Plans to continue exercise at Home (comment)       Frequency Add 4 additional days to program exercise sessions.       Initial Home Exercises Provided  09/27/22                Exercise Comments:   Exercise Comments     Row Name 09/11/22 0848 09/27/22 0832 10/13/22 0845 10/25/22 0846 11/15/22 0827   Exercise Comments Pt first day in the [facility] program. Pt tolerated exercise well with an average MET level of 2.5. Pt is off to a good start and is learning his THRR, RPE and ExRx. Will continue to monitor pt and progress workloads as tolerated without sign or symptom REVD MET's, goals and home ExRx. Pt tolerated exercise well with an average MET level of 3.35. Pt is doing well and progressing his MET's, he INC WL on both stations today. Pt feels good about his goals of getting into a regular Ex routine and eating a more heart healthy diet. His family is very supportive. He will continue to exercise bywalking, using his treadmill, stationary bike and handweights 2-4 days for 30-45 mins REVD MET's today. Pt tolerated exercise well with an average MET level of 4.0. Pt is progressing well. He has been out of town for work, but is settling back into his exercise routine with some mild leg fatigue, he was able to walk while away and has already increased MET's since last review. Will continue to increase WL as tolerated without S/S REVD MET's and goals. Pt tolerated exercise well with an average MET level of 4.4. Pt feels good about his goals and is increasing strength and stamina. He is walking more and he just got an e-bike that he's enjoying. REVD MET's. Pt tolerated exercise well with an average MET level of 4.93. Pt feels good about his progress and is interested in working at higher intensitities. Will reach out to cardiologist team about  interval training    Row Name 12/06/22 [phone]           Exercise Comments REVD MET's and goals. Pt tolerated exercise well with an average MET level of 5.54. Pt feels good about his goals and is progressing very well with his MET's. He has been able to recieve a lot of materials from the RD and feels good about his  progress                Exercise Goals and Review:   Exercise Goals     Row Name 09/05/22 0855             Exercise Goals   Increase Physical Activity Yes       Intervention Provide advice, education, support and counseling about physical activity/exercise needs.;Develop an individualized exercise prescription for aerobic and resistive training based on initial evaluation findings, risk stratification, comorbidities and participant's personal goals.       Expected Outcomes Short Term: Attend rehab on a regular basis to increase amount of physical activity.;Long Term: Exercising regularly at least 3-5 days a week.;Long Term: Add in home exercise to make exercise part of routine and to increase amount of physical activity.       Increase Strength and Stamina Yes       Intervention Provide advice, education, support and counseling about physical activity/exercise needs.;Develop an individualized exercise prescription for aerobic and resistive training based on initial evaluation findings, risk stratification, comorbidities and participant's personal goals.       Expected Outcomes Short Term: Increase workloads from initial exercise prescription for resistance, speed, and METs.;Short Term: Perform resistance training exercises routinely during rehab and add in resistance training at home;Long Term: Improve cardiorespiratory fitness, muscular endurance and strength as measured by increased METs and functional capacity ( )       Able to understand and use rate of perceived exertion (RPE) scale Yes       Intervention Provide education and explanation on how to use RPE scale       Expected Outcomes Short Term: Able to use RPE daily in rehab to express subjective intensity level;Long Term:  Able to use RPE to guide intensity level when exercising independently       Knowledge and understanding of Target Heart Rate Range (THRR) Yes       Intervention Provide education and explanation of THRR  including how the numbers were predicted and where they are located for reference       Expected Outcomes Short Term: Able to state/look up THRR;Short Term: Able to use daily as guideline for intensity in rehab;Long Term: Able to use THRR to govern intensity when exercising independently       Able to check pulse independently Yes       Intervention Provide education and demonstration on how to check pulse in carotid and radial arteries.;Review the importance of being able to check your own pulse for safety during independent exercise       Expected Outcomes Short Term: Able to explain why pulse checking is important during independent exercise;Long Term: Able to check pulse independently and accurately       Understanding of Exercise Prescription Yes       Intervention Provide education, explanation, and written materials on patient's individual exercise prescription       Expected Outcomes Short Term: Able to explain program exercise prescription;Long Term: Able to explain home exercise prescription to exercise independently  Exercise Goals Re-Evaluation :  Exercise Goals Re-Evaluation     Row Name 09/11/22 0847 09/27/22 0827 10/25/22 0840 12/06/22 0818       Exercise Goal Re-Evaluation   Exercise Goals Review Increase Physical Activity;Understanding of Exercise Prescription;Increase Strength and Stamina;Knowledge and understanding of Target Heart Rate Range (THRR);Able to understand and use rate of perceived exertion (RPE) scale Increase Physical Activity;Understanding of Exercise Prescription;Increase Strength and Stamina;Knowledge and understanding of Target Heart Rate Range (THRR);Able to understand and use rate of perceived exertion (RPE) scale Increase Physical Activity;Understanding of Exercise Prescription;Increase Strength and Stamina;Knowledge and understanding of Target Heart Rate Range (THRR);Able to understand and use rate of perceived exertion (RPE) scale Increase  Physical Activity;Understanding of Exercise Prescription;Increase Strength and Stamina;Knowledge and understanding of Target Heart Rate Range (THRR);Able to understand and use rate of perceived exertion (RPE) scale    Comments Pt first day in the CRP2 program. Pt tolerated exercise well with an average MET level of 2.5. Pt is off to a good start and is learning his THRR, RPE and ExRx. REVD MET's, goals and home ExRx. Pt tolerated exercise well with an average MET level of 3.35. Pt is doing well and progressing his MET's, he INC WL on both stations today. Pt feels good about his goals of getting into a regular Ex routine and eating a more heart healthy diet. His family is very supportive. He will continue to exercise bywalking, using his treadmill, stationary bike and handweights 2-4 days for 30-45 mins REVD MET's and goals. Pt tolerated exercise well with an average MET level of 4.4. Pt feels good about his goals and is increasing strength and stamina. He is walking more and he just got an e-bike that he's enjoying. REVD MET's and goals. Pt tolerated exercise well with an average MET level of 5.54. Pt feels good about his goals and is progressing very well with his MET's. He has been able to recieve a lot of materials from the RD and feels good about his progress    Expected Outcomes Will continue to monitor pt and progress workloads as tolerated without sign or symptom Will continue to monitor pt and progress workloads as tolerated without sign or symptom Will continue to monitor pt and progress workloads as tolerated without sign or symptom Will continue to monitor pt and progress workloads as tolerated without sign or symptom             Discharge Exercise Prescription (Final Exercise Prescription Changes):  Exercise Prescription Changes - 12/06/22 0814       Response to Exercise   Blood Pressure (Admit) 98/56    Blood Pressure (Exercise) 142/60    Blood Pressure (Exit) 98/68    Heart Rate  (Admit) 62 bpm    Heart Rate (Exercise) 127 bpm    Heart Rate (Exit) 69 bpm    Rating of Perceived Exertion (Exercise) 11.5    Perceived Dyspnea (Exercise) 0    Symptoms 0    Comments REVD MET's and goals    Duration Progress to 30 minutes of  aerobic without signs/symptoms of physical distress    Intensity THRR unchanged      Progression   Progression Continue to progress workloads to maintain intensity without signs/symptoms of physical distress.    Average METs 5.54      Resistance Training   Training Prescription No      Treadmill   MPH 3.8    Grade 3    Minutes 15    METs  5.48      NuStep   Level 6    SPM 128    Minutes 15    METs 5.6      Home Exercise Plan   Plans to continue exercise at Home (comment)    Frequency Add 4 additional days to program exercise sessions.    Initial Home Exercises Provided 09/27/22             Nutrition:  Target Goals: Understanding of nutrition guidelines, daily intake of sodium 1500mg , cholesterol 200mg , calories 30% from fat and 7% or less from saturated fats, daily to have 5 or more servings of fruits and vegetables.  Biometrics:  Pre Biometrics - 09/05/22 0800       Pre Biometrics   Waist Circumference 45.75 inches    Hip Circumference 42 inches    Waist to Hip Ratio 1.09 %    Triceps Skinfold 15 mm    % Body Fat 31.5 %    Grip Strength 58 kg    Flexibility 0 in    Single Leg Stand 28.68 seconds              Nutrition Therapy Plan and Nutrition Goals:  Nutrition Therapy & Goals - 11/08/22 1023       Nutrition Therapy   Diet Heart Healthy Diet    Drug/Food Interactions Statins/Certain Fruits      Personal Nutrition Goals   Nutrition Goal Patient to identify strategies for reducing cardiovascular risk by attending the [facility] education and nutrition series weekly.   goal in action.   Personal Goal #2 Patient to improve diet quality by using the plate method as a guide for meal planning to include lean  protein/plant protein, fruits, vegetables, whole grains, nonfat dairy as part of a well-balanced diet.   goal in action.   Personal Goal #3 Patient to limit sodium to 1500mg  per day   goal in action.   Comments Goals in action. Brian Elliott continues to attend the [employer] and nutrition series regularly. He had started to make dietary chagnes including reduced sodium intake, reduced saturated fat intake, and increased dietary fiber. He is down 13.2# since starting with our program. His LDL has improved to <55. Patient will benefit from participation in intensive cardiac rehab for nutrition, exercise, and lifestyle modification.      Intervention Plan   Intervention Prescribe, educate and counsel regarding individualized specific dietary modifications aiming towards targeted core components such as weight, hypertension, lipid management, diabetes, heart failure and other comorbidities.;Nutrition handout(s) given to patient.    Expected Outcomes Short Term Goal: Understand basic principles of dietary content, such as calories, fat, sodium, cholesterol and nutrients.;Long Term Goal: Adherence to prescribed nutrition plan.             Nutrition Assessments:  Nutrition Assessments - 09/15/22 0821       Rate Your Plate Scores   Pre Score 52            MEDIFICTS Score Key: >=70 Need to make dietary changes  40-70 Heart Healthy Diet <= 40 Therapeutic Level Cholesterol Diet   Flowsheet Row INTENSIVE CARDIAC REHAB from 09/13/2022 in [facility]  Picture Your Plate Total Score on Admission 52      Picture Your Plate Scores: <08 Unhealthy dietary pattern with much room for improvement. 41-50 Dietary pattern unlikely to meet recommendations for good health and room for improvement. 51-60 More healthful dietary pattern, with some  room for improvement.  >60 Healthy dietary pattern, although there may be some specific behaviors  that could be improved.    Nutrition Goals Re-Evaluation:  Nutrition Goals Re-Evaluation     Row Name 09/11/22 1049 10/09/22 1459 11/08/22 1023         Goals   Current Weight 227 lb 1.2 oz (103 kg) 223 lb 15.8 oz (101.6 kg)  weight from last attended session on 7/17 213 lb 13.5 oz (97 kg)     Comment lipids WNL-LDL 90, HDL 36, A1c 5.9 labs from 2/95 awaiting results. Most recent labs  lipids WNL-LDL 90, HDL 36, A1c 5.9 LDL 54, HDL 35; other most recent labs A1c 5.9     Expected Outcome Patient will benefit from participation in intensive cardiac rehab for nutrition, exercise, and lifestyle modification. Goals in progress. Brian Elliott has not attended cardiac rehab since 7/17; but prior to absences, patient was attending the [facility] education and nutrition series regularly. He had started to make dietary chagnes including reduced sodium intake, reduced saturated fat intake, and increased dietary fiber. From his last attended session, patient was down 3.1#. Patient will benefit from participation in intensive cardiac rehab for nutrition, exercise, and lifestyle modification. Goals in action. Brian Elliott continues to attend the [employer] and nutrition series regularly. He had started to make dietary chagnes including reduced sodium intake, reduced saturated fat intake, and increased dietary fiber. He is down 13.2# since starting with our program. His LDL has improved to <55. Patient will benefit from participation in intensive cardiac rehab for nutrition, exercise, and lifestyle modification.              Nutrition Goals Re-Evaluation:  Nutrition Goals Re-Evaluation     Row Name 09/11/22 1049 10/09/22 1459 11/08/22 1023         Goals   Current Weight 227 lb 1.2 oz (103 kg) 223 lb 15.8 oz (101.6 kg)  weight from last attended session on 7/17 213 lb 13.5 oz (97 kg)     Comment lipids WNL-LDL 90, HDL 36, A1c 5.9 labs from 2/84 awaiting results. Most recent labs  lipids WNL-LDL 90, HDL 36,  A1c 5.9 LDL 54, HDL 35; other most recent labs A1c 5.9     Expected Outcome Patient will benefit from participation in intensive cardiac rehab for nutrition, exercise, and lifestyle modification. Goals in progress. Brian Elliott has not attended cardiac rehab since 7/17; but prior to absences, patient was attending the [facility] education and nutrition series regularly. He had started to make dietary chagnes including reduced sodium intake, reduced saturated fat intake, and increased dietary fiber. From his last attended session, patient was down 3.1#. Patient will benefit from participation in intensive cardiac rehab for nutrition, exercise, and lifestyle modification. Goals in action. Brian Elliott continues to attend the [employer] and nutrition series regularly. He had started to make dietary chagnes including reduced sodium intake, reduced saturated fat intake, and increased dietary fiber. He is down 13.2# since starting with our program. His LDL has improved to <55. Patient will benefit from participation in intensive cardiac rehab for nutrition, exercise, and lifestyle modification.              Nutrition Goals Discharge (Final Nutrition Goals Re-Evaluation):  Nutrition Goals Re-Evaluation - 11/08/22 1023       Goals   Current Weight 213 lb 13.5 oz (97 kg)    Comment LDL 54, HDL 35; other most recent labs A1c 5.9    Expected Outcome Goals in action. Brian Elliott  continues to attend the Prit[facility]in education and nutrition series regularly. He had started to make dietary chagnes including reduced sodium intake, reduced saturated fat intake, and increased dietary fiber. He is down 13.2# since starting with our program. His LDL has improved to <55. Patient will benefit from participation in intensive cardiac rehab for nutrition, exercise, and lifestyle modification.             Psychosocial: Target Goals: Acknowledge presence or absence of significant depression and/or stress, maximize coping skills,  provide positive support system. Participant is able to verbalize types and ability to use techniques and skills needed for reducing stress and depression.  Initial Review & Psychosocial Screening:  Initial Psych Review & Screening - 09/05/22 0927       Initial Review   Current issues with None Identified      Family Dynamics   Good Support System? Yes    Comments Pt stated he has a good support system, and he has a positive attitude toward cardiac rehab.      Barriers   Psychosocial barriers to participate in program There are no identifiable barriers or psychosocial needs.      Screening Interventions   Interventions Encouraged to exercise;Provide feedback about the scores to participant    Expected Outcomes Long Term Goal: Stressors or current issues are controlled or eliminated.;Long Term goal: The participant improves quality of Life and PHQ9 Scores as seen by post scores and/or verbalization of changes             Quality of Life Scores:  Quality of Life - 09/05/22 1004       Quality of Life   Select Quality of Life      Quality of Life Scores   Health/Function Pre 28.67 %    Socioeconomic Pre 30 %    Psych/Spiritual Pre 29.14 %    Family Pre 30 %    GLOBAL Pre 29.24 %            Scores of 19 and below usually indicate a poorer quality of life in these areas.  A difference of  2-3 points is a clinically meaningful difference.  A difference of 2-3 points in the total score of the Quality of Life Index has been associated with significant improvement in overall quality of life, self-image, physical symptoms, and general health in studies assessing change in quality of life.  PHQ-9: Review Flowsheet       09/05/2022  Depression screen PHQ 2/9  Decreased Interest 0  Down, Depressed, Hopeless 0  PHQ - 2 Score 0  Altered sleeping 0  Tired, decreased energy 0  Change in appetite 0  Feeling bad or failure about yourself  0  Trouble concentrating 0  Moving  slowly or fidgety/restless 0  Suicidal thoughts 0  PHQ-9 Score 0    Details           Interpretation of Total Score  Total Score Depression Severity:  1-4 = Minimal depression, 5-9 = Mild depression, 10-14 = Moderate depression, 15-19 = Moderately severe depression, 20-27 = Severe depression   Psychosocial Evaluation and Intervention:  Psychosocial Evaluation - 09/11/22 1322       Psychosocial Evaluation & Interventions   Interventions Encouraged to exercise with the program and follow exercise prescription    Continue Psychosocial Services  No Follow up required             Psychosocial Re-Evaluation:  Psychosocial Re-Evaluation     Row Name 10/10/22 [phone]  11/03/22 6433 12/05/22 2951         Psychosocial Re-Evaluation   Current issues with None Identified None Identified None Identified     Comments -- Brian Elliott completes cardiac rehab on 11/17/22. No psychosocial needs identified. No interventions needed Brian Elliott completes cardiac rehab on  10//04/24. No psychosocial needs identified. No interventions needed     Interventions Encouraged to attend Cardiac Rehabilitation for the exercise Encouraged to attend Cardiac Rehabilitation for the exercise Encouraged to attend Cardiac Rehabilitation for the exercise     Continue Psychosocial Services  No Follow up required No Follow up required No Follow up required              Psychosocial Discharge (Final Psychosocial Re-Evaluation):  Psychosocial Re-Evaluation - 12/05/22 0826       Psychosocial Re-Evaluation   Current issues with None Identified    Comments Brian Elliott completes cardiac rehab on  10//04/24. No psychosocial needs identified. No interventions needed    Interventions Encouraged to attend Cardiac Rehabilitation for the exercise    Continue Psychosocial Services  No Follow up required             Vocational Rehabilitation: Provide vocational rehab assistance to qualifying candidates.   Vocational Rehab  Evaluation & Intervention:  Vocational Rehab - 09/05/22 0953       Initial Vocational Rehab Evaluation & Intervention   Assessment shows need for Vocational Rehabilitation No             Education: Education Goals: Education classes will be provided on a weekly basis, covering required topics. Participant will state understanding/return demonstration of topics presented.    Education     Row Name 09/11/22 0900     Education   Cardiac Education Topics Pritikin   Select Core Videos     Core Videos   Educator Dietitian   Select Nutrition   Nutrition Calorie Density   Instruction Review Code 1- Verbalizes Understanding   Class Start Time 0815   Class Stop Time 0857   Class Time Calculation (min) 42 min    Row Name 09/13/22 1000     Education   Cardiac Education Topics Pritikin   Secondary school teacher School   Educator Dietitian   Weekly Topic Efficiency Cooking - Meals in a Snap   Instruction Review Code 1- Verbalizes Understanding   Class Start Time 0815   Class Stop Time 0850   Class Time Calculation (min) 35 min    Row Name 09/15/22 0800     Education   Cardiac Education Topics Pritikin   Psychologist, forensic Exercise Education   Exercise Education Move It!   Instruction Review Code 1- Verbalizes Understanding   Class Start Time [phone]   Class Stop Time 0844   Class Time Calculation (min) 33 min    Row Name 09/18/22 0900     Education   Cardiac Education Topics Pritikin   Select Workshops     Workshops   Educator Exercise Physiologist   Select Psychosocial   Psychosocial Workshop Focused Goals, Sustainable Changes   Instruction Review Code 1- Verbalizes Understanding   Class Start Time [phone]   Class Stop Time 0845   Class Time Calculation (min) 35 min    Row Name 09/20/22 0800     Education   Cardiac Education Topics Pritikin   [employer]  Educator Dietitian   Weekly Topic One-Pot Wonders   Instruction Review Code 1- Verbalizes Understanding   Class Start Time [phone]   Class Stop Time [phone]   Class Time Calculation (min) 32 min    Row Name 09/22/22 0800     Education   Cardiac Education Topics Pritikin   Select Core Videos     Core Videos   Educator Exercise Physiologist   Select General Education   General Education Hypertension and Heart Disease   Instruction Review Code 1- Verbalizes Understanding   Class Start Time 0815   Class Stop Time 0850   Class Time Calculation (min) 35 min    Row Name 09/27/22 1000     Education   Cardiac Education Topics Pritikin   Orthoptist   Educator Dietitian   Weekly Topic Comforting Weekend Breakfasts   Instruction Review Code 1- Verbalizes Understanding   Class Start Time 0815   Class Stop Time [phone]   Class Time Calculation (min) 31 min    Row Name 10/11/22 1000     Education   Cardiac Education Topics Pritikin   Customer service manager   Weekly Topic International Cuisine- Spotlight on the [facility] Zones   Instruction Review Code 1- Verbalizes Understanding   Class Start Time 0815   Class Stop Time 0850   Class Time Calculation (min) 35 min    Row Name 10/13/22 0900     Education   Cardiac Education Topics Pritikin   Select Core Videos     Core Videos   Educator Exercise Physiologist   Select Exercise Education   Exercise Education Improving Performance   Instruction Review Code 1- Verbalizes Understanding   Class Start Time 0810   Class Stop Time 0846   Class Time Calculation (min) 36 min    Row Name 10/16/22 0900     Education   Cardiac Education Topics Pritikin   Select Workshops     Workshops   Educator Dietitian   Select Nutrition   Nutrition Workshop Fueling a Forensic psychologist   Instruction Review Code 1- TEFL teacher Understanding   Class Start Time 0815   Class Stop Time 0900    Class Time Calculation (min) 45 min    Row Name 10/18/22 0900     Education   Cardiac Education Topics Pritikin   Secondary school teacher School   Educator Dietitian   Weekly Topic Simple Sides and Sauces   Instruction Review Code 1- Verbalizes Understanding   Class Start Time 0815   Class Stop Time 0846   Class Time Calculation (min) 31 min    Row Name 10/20/22 0900     Education   Cardiac Education Topics Pritikin   Select Core Videos     Core Videos   Educator Exercise Physiologist   Select Psychosocial   Psychosocial How Our Thoughts Can Heal Our Hearts   Instruction Review Code 1- Verbalizes Understanding   Class Start Time 0807   Class Stop Time 0842   Class Time Calculation (min) 35 min    Row Name 10/23/22 1000     Education   Cardiac Education Topics Pritikin   Select Workshops     Workshops   Educator Exercise Physiologist   Select Exercise   Exercise Workshop Managing Heart Disease: Your Path to a Healthier Heart   Instruction Review Code 1- Verbalizes Understanding  Class Start Time 0808   Class Stop Time 0908   Class Time Calculation (min) 60 min    Row Name 10/30/22 0900     Education   Cardiac Education Topics Pritikin   Select Workshops     Core Videos   Educator --   Select --   Psychosocial --   Instruction Review Code --   Class Start Time --   Class Stop Time --   Class Time Calculation (min) --     Workshops   Biomedical scientist Psychosocial   Psychosocial Workshop From Head to Heart: The Power of a Healthy Outlook   Instruction Review Code 1- Verbalizes Understanding   Class Start Time 0815   Class Stop Time 0904   Class Time Calculation (min) 49 min    Row Name 11/01/22 1000     Education   Cardiac Education Topics Pritikin   Secondary school teacher School   Educator Dietitian   Weekly Topic Adding Flavor - Sodium-Free   Instruction Review Code 1- Verbalizes Understanding    Class Start Time [phone]   Class Stop Time 0845   Class Time Calculation (min) 31 min    Row Name 11/06/22 1100     Education   Cardiac Education Topics Pritikin   Select Core Videos     Core Videos   Educator Dietitian   Select Nutrition   Nutrition Overview of the Pritikin Eating Plan   Instruction Review Code 1- Verbalizes Understanding   Class Start Time 0815   Class Stop Time 0850   Class Time Calculation (min) 35 min    Row Name 11/08/22 1100     Education   Cardiac Education Topics Pritikin   Orthoptist   Educator Dietitian   Weekly Topic Fast and Healthy Breakfasts   Instruction Review Code 1- Verbalizes Understanding   Class Start Time 0815   Class Stop Time 0847   Class Time Calculation (min) 32 min    Row Name 11/10/22 0800     Education   Cardiac Education Topics Pritikin   Nurse, children's   Educator Exercise Physiologist   Select Psychosocial   Psychosocial Healthy Minds, Bodies, Hearts   Instruction Review Code 1- Verbalizes Understanding   Class Start Time 0813   Class Stop Time 0845   Class Time Calculation (min) 32 min    Row Name 11/15/22 1000     Education   Cardiac Education Topics Pritikin   Secondary school teacher School   Educator Dietitian   Weekly Topic Personalizing Your Pritikin Plate   Instruction Review Code 1- Verbalizes Understanding   Class Start Time 0815   Class Stop Time 0848   Class Time Calculation (min) 33 min    Row Name 11/17/22 0900     Education   Cardiac Education Topics Pritikin   Select Workshops     Workshops   Educator Exercise Physiologist   Select Exercise   Exercise Workshop Location manager and Fall Prevention   Instruction Review Code 1- Verbalizes Understanding   Class Start Time [phone]   Class Stop Time 0856   Class Time Calculation (min) 45 min    Row Name 11/22/22 1100     Education   Cardiac Education Topics Pritikin    Set designer  Dietitian   Weekly Topic Delicious Desserts   Instruction Review Code 1- Verbalizes Understanding   Class Start Time 0820   Class Stop Time 0900   Class Time Calculation (min) 40 min    Row Name 11/24/22 1000     Education   Cardiac Education Topics Pritikin   Select Core Videos     Core Videos   Educator Dietitian   Nutrition Other  Label Reading   Instruction Review Code 1- Verbalizes Understanding   Class Start Time 0815   Class Stop Time 0900   Class Time Calculation (min) 45 min    Row Name 11/27/22 1100     Education   Cardiac Education Topics Pritikin   Select Workshops     Workshops   Educator Exercise Physiologist   Select Psychosocial   Psychosocial Workshop Recognizing and Reducing Stress   Instruction Review Code 1- Verbalizes Understanding   Class Start Time 0815   Class Stop Time 0900   Class Time Calculation (min) 45 min    Row Name 11/29/22 1000     Education   Cardiac Education Topics Pritikin   Secondary school teacher School   Educator Dietitian   Weekly Topic Tasty Appetizers and Snacks   Instruction Review Code 1- Verbalizes Understanding   Class Start Time 0815   Class Stop Time 0900   Class Time Calculation (min) 45 min    Row Name 12/01/22 1300     Education   Cardiac Education Topics Pritikin   Nurse, children's Exercise Physiologist   Select Nutrition   Nutrition Calorie Density   Instruction Review Code 1- Verbalizes Understanding   Class Start Time [phone]   Class Stop Time 0900   Class Time Calculation (min) 44 min    Row Name 12/04/22 0900     Education   Cardiac Education Topics Pritikin   Select Workshops     Workshops   Educator Exercise Physiologist   Select Exercise   Exercise Workshop Exercise Basics: Building Your Action Plan   Instruction Review Code 1- Verbalizes Understanding   Class Start Time 0813   Class Stop  Time 0900   Class Time Calculation (min) 47 min            Core Videos: Exercise    Move It!  Clinical staff conducted group or individual video education with verbal and written material and guidebook.  Patient learns the recommended Pritikin exercise program. Exercise with the goal of living a long, healthy life. Some of the health benefits of exercise include controlled diabetes, healthier blood pressure levels, improved cholesterol levels, improved heart and lung capacity, improved sleep, and better body composition. Everyone should speak with their doctor before starting or changing an exercise routine.  Biomechanical Limitations Clinical staff conducted group or individual video education with verbal and written material and guidebook.  Patient learns how biomechanical limitations can impact exercise and how we can mitigate and possibly overcome limitations to have an impactful and balanced exercise routine.  Body Composition Clinical staff conducted group or individual video education with verbal and written material and guidebook.  Patient learns that body composition (ratio of muscle mass to fat mass) is a key component to assessing overall fitness, rather than body weight alone. Increased fat mass, especially visceral belly fat, can put [place] at increased risk for metabolic syndrome, type 2 diabetes, heart disease, and even death. It is recommended to  combine diet and exercise (cardiovascular and resistance training) to improve your body composition. Seek guidance from your physician and exercise physiologist before implementing an exercise routine.  Exercise Action Plan Clinical staff conducted group or individual video education with verbal and written material and guidebook.  Patient learns the recommended strategies to achieve and enjoy long-term exercise adherence, including variety, self-motivation, self-efficacy, and positive decision making. Benefits of exercise include  fitness, good health, weight management, more energy, better sleep, less stress, and overall well-being.  Medical   Heart Disease Risk Reduction Clinical staff conducted group or individual video education with verbal and written material and guidebook.  Patient learns our heart is our most vital organ as it circulates oxygen, nutrients, white blood cells, and hormones throughout the entire body, and carries waste away. Data supports a plant-based eating plan like the Pritikin Program for its effectiveness in slowing progression of and reversing heart disease. The video provides a number of recommendations to address heart disease.   Metabolic Syndrome and Belly Fat  Clinical staff conducted group or individual video education with verbal and written material and guidebook.  Patient learns what metabolic syndrome is, how it leads to heart disease, and how one can reverse it and keep it from coming back. You have metabolic syndrome if you have 3 of the following 5 criteria: abdominal obesity, high blood pressure, high triglycerides, low HDL cholesterol, and high blood sugar.  Hypertension and Heart Disease Clinical staff conducted group or individual video education with verbal and written material and guidebook.  Patient learns that high blood pressure, or hypertension, is very common in the [place]. Hypertension is largely due to excessive salt intake, but other important risk factors include being overweight, physical inactivity, drinking too much alcohol, smoking, and not eating enough potassium from fruits and vegetables. High blood pressure is a leading risk factor for heart attack, stroke, congestive heart failure, dementia, kidney failure, and premature death. Long-term effects of excessive salt intake include stiffening of the arteries and thickening of heart muscle and organ damage. Recommendations include ways to reduce hypertension and the risk of heart disease.  Diseases of Our Time  - Focusing on Diabetes Clinical staff conducted group or individual video education with verbal and written material and guidebook.  Patient learns why the best way to stop diseases of our time is prevention, through food and other lifestyle changes. Medicine (such as prescription pills and surgeries) is often only a Band-Aid on the problem, not a long-term solution. Most common diseases of our time include obesity, type 2 diabetes, hypertension, heart disease, and cancer. The Pritikin Program is recommended and has been proven to help reduce, reverse, and/or prevent the damaging effects of metabolic syndrome.  Nutrition   Overview of the Pritikin Eating Plan  Clinical staff conducted group or individual video education with verbal and written material and guidebook.  Patient learns about the Pritikin Eating Plan for disease risk reduction. The Pritikin Eating Plan emphasizes a wide variety of unrefined, minimally-processed carbohydrates, like fruits, vegetables, whole grains, and legumes. Go, Caution, and Stop food choices are explained. Plant-based and lean animal proteins are emphasized. Rationale provided for low sodium intake for blood pressure control, low added sugars for blood sugar stabilization, and low added fats and oils for coronary artery disease risk reduction and weight management.  Calorie Density  Clinical staff conducted group or individual video education with verbal and written material and guidebook.  Patient learns about calorie density and how it impacts the Pritikin  Eating Plan. Knowing the characteristics of the food you choose will help you decide whether those foods will lead to weight gain or weight loss, and whether you want to consume more or less of them. Weight loss is usually a side effect of the Pritikin Eating Plan because of its focus on low calorie-dense foods.  Label Reading  Clinical staff conducted group or individual video education with verbal and written  material and guidebook.  Patient learns about the Pritikin recommended label reading guidelines and corresponding recommendations regarding calorie density, added sugars, sodium content, and whole grains.  Dining Out - Part 1  Clinical staff conducted group or individual video education with verbal and written material and guidebook.  Patient learns that restaurant meals can be sabotaging because they can be so high in calories, fat, sodium, and/or sugar. Patient learns recommended strategies on how to positively address this and avoid unhealthy pitfalls.  Facts on Fats  Clinical staff conducted group or individual video education with verbal and written material and guidebook.  Patient learns that lifestyle modifications can be just as effective, if not more so, as many medications for lowering your risk of heart disease. A Pritikin lifestyle can help to reduce your risk of inflammation and atherosclerosis (cholesterol build-up, or plaque, in the artery walls). Lifestyle interventions such as dietary choices and physical activity address the cause of atherosclerosis. A review of the types of fats and their impact on blood cholesterol levels, along with dietary recommendations to reduce fat intake is also included.  Nutrition Action Plan  Clinical staff conducted group or individual video education with verbal and written material and guidebook.  Patient learns how to incorporate Pritikin recommendations into their lifestyle. Recommendations include planning and keeping personal health goals in mind as an important part of their success.  Healthy Mind-Set    Healthy Minds, Bodies, Hearts  Clinical staff conducted group or individual video education with verbal and written material and guidebook.  Patient learns how to identify when they are stressed. Video will discuss the impact of that stress, as well as the many benefits of stress management. Patient will also be introduced to stress management  techniques. The way we think, act, and feel has an impact on our hearts.  How Our Thoughts Can Heal Our Hearts  Clinical staff conducted group or individual video education with verbal and written material and guidebook.  Patient learns that negative thoughts can cause depression and anxiety. This can result in negative lifestyle behavior and serious health problems. Cognitive behavioral therapy is an effective method to help control our thoughts in order to change and improve our emotional outlook.  Additional Videos:  Exercise    Improving Performance  Clinical staff conducted group or individual video education with verbal and written material and guidebook.  Patient learns to use a non-linear approach by alternating intensity levels and lengths of time spent exercising to help burn more calories and lose more body fat. Cardiovascular exercise helps improve heart health, metabolism, hormonal balance, blood sugar control, and recovery from fatigue. Resistance training improves strength, endurance, balance, coordination, reaction time, metabolism, and muscle mass. Flexibility exercise improves circulation, posture, and balance. Seek guidance from your physician and exercise physiologist before implementing an exercise routine and learn your capabilities and proper form for all exercise.  Introduction to Yoga  Clinical staff conducted group or individual video education with verbal and written material and guidebook.  Patient learns about yoga, a discipline of the coming together of mind, breath, and  body. The benefits of yoga include improved flexibility, improved range of motion, better posture and core strength, increased lung function, weight loss, and positive self-image. Yoga's heart health benefits include lowered blood pressure, healthier heart rate, decreased cholesterol and triglyceride levels, improved immune function, and reduced stress. Seek guidance from your physician and exercise  physiologist before implementing an exercise routine and learn your capabilities and proper form for all exercise.  Medical   Aging: Enhancing Your Quality of Life  Clinical staff conducted group or individual video education with verbal and written material and guidebook.  Patient learns key strategies and recommendations to stay in good physical health and enhance quality of life, such as prevention strategies, having an advocate, securing a Health Care Proxy and Power of Attorney, and keeping a list of medications and system for tracking them. It also discusses how to avoid risk for bone loss.  Biology of Weight Control  Clinical staff conducted group or individual video education with verbal and written material and guidebook.  Patient learns that weight gain occurs because we consume more calories than we burn (eating more, moving less). Even if your body weight is normal, you may have higher ratios of fat compared to muscle mass. Too much body fat puts you at increased risk for cardiovascular disease, heart attack, stroke, type 2 diabetes, and obesity-related cancers. In addition to exercise, following the Pritikin Eating Plan can help reduce your risk.  Decoding Lab Results  Clinical staff conducted group or individual video education with verbal and written material and guidebook.  Patient learns that lab test reflects one measurement whose values change over time and are influenced by many factors, including medication, stress, sleep, exercise, food, hydration, pre-existing medical conditions, and more. It is recommended to use the knowledge from this video to become more involved with your lab results and evaluate your numbers to speak with your doctor.   Diseases of Our Time - Overview  Clinical staff conducted group or individual video education with verbal and written material and guidebook.  Patient learns that according to the CDC, 50% to 70% of chronic diseases (such as obesity,  type 2 diabetes, elevated lipids, hypertension, and heart disease) are avoidable through lifestyle improvements including healthier food choices, listening to satiety cues, and increased physical activity.  Sleep Disorders Clinical staff conducted group or individual video education with verbal and written material and guidebook.  Patient learns how good quality and duration of sleep are important to overall health and well-being. Patient also learns about sleep disorders and how they impact health along with recommendations to address them, including discussing with a physician.  Nutrition  Dining Out - Part 2 Clinical staff conducted group or individual video education with verbal and written material and guidebook.  Patient learns how to plan ahead and communicate in order to maximize their dining experience in a healthy and nutritious manner. Included are recommended food choices based on the type of restaurant the patient is visiting.   Fueling a Banker conducted group or individual video education with verbal and written material and guidebook.  There is a strong connection between our food choices and our health. Diseases like obesity and type 2 diabetes are very prevalent and are in large-part due to lifestyle choices. The Pritikin Eating Plan provides plenty of food and hunger-curbing satisfaction. It is easy to follow, affordable, and helps reduce health risks.  Menu Workshop  Clinical staff conducted group or individual video education with verbal and written  material and guidebook.  Patient learns that restaurant meals can sabotage health goals because they are often packed with calories, fat, sodium, and sugar. Recommendations include strategies to plan ahead and to communicate with the manager, chef, or server to help order a healthier meal.  Planning Your Eating Strategy  Clinical staff conducted group or individual video education with verbal and written  material and guidebook.  Patient learns about the Pritikin Eating Plan and its benefit of reducing the risk of disease. The Pritikin Eating Plan does not focus on calories. Instead, it emphasizes high-quality, nutrient-rich foods. By knowing the characteristics of the foods, we choose, we can determine their calorie density and make informed decisions.  Targeting Your Nutrition Priorities  Clinical staff conducted group or individual video education with verbal and written material and guidebook.  Patient learns that lifestyle habits have a tremendous impact on disease risk and progression. This video provides eating and physical activity recommendations based on your personal health goals, such as reducing LDL cholesterol, losing weight, preventing or controlling type 2 diabetes, and reducing high blood pressure.  Vitamins and Minerals  Clinical staff conducted group or individual video education with verbal and written material and guidebook.  Patient learns different ways to obtain key vitamins and minerals, including through a recommended healthy diet. It is important to discuss all supplements you take with your doctor.   Healthy Mind-Set    Smoking Cessation  Clinical staff conducted group or individual video education with verbal and written material and guidebook.  Patient learns that cigarette smoking and tobacco addiction pose a serious health risk which affects millions of people. Stopping smoking will significantly reduce the risk of heart disease, lung disease, and many forms of cancer. Recommended strategies for quitting are covered, including working with your doctor to develop a successful plan.  Culinary   Becoming a Set designer conducted group or individual video education with verbal and written material and guidebook.  Patient learns that cooking at home can be healthy, cost-effective, quick, and puts them in control. Keys to cooking healthy recipes will  include looking at your recipe, assessing your equipment needs, planning ahead, making it simple, choosing cost-effective seasonal ingredients, and limiting the use of added fats, salts, and sugars.  Cooking - Breakfast and Snacks  Clinical staff conducted group or individual video education with verbal and written material and guidebook.  Patient learns how important breakfast is to satiety and nutrition through the entire day. Recommendations include key foods to eat during breakfast to help stabilize blood sugar levels and to prevent overeating at meals later in the day. Planning ahead is also a key component.  Cooking - Educational psychologist conducted group or individual video education with verbal and written material and guidebook.  Patient learns eating strategies to improve overall health, including an approach to cook more at home. Recommendations include thinking of animal protein as a side on your plate rather than center stage and focusing instead on lower calorie dense options like vegetables, fruits, whole grains, and plant-based proteins, such as beans. Making sauces in large quantities to freeze for later and leaving the skin on your vegetables are also recommended to maximize your experience.  Cooking - Healthy Salads and Dressing Clinical staff conducted group or individual video education with verbal and written material and guidebook.  Patient learns that vegetables, fruits, whole grains, and legumes are the foundations of the Pritikin Eating Plan. Recommendations include how to incorporate each of  these in flavorful and healthy salads, and how to create homemade salad dressings. Proper handling of ingredients is also covered. Cooking - Soups and [employer] - Soups and Desserts Clinical staff conducted group or individual video education with verbal and written material and guidebook.  Patient learns that Pritikin soups and desserts make for easy, nutritious, and  delicious snacks and meal components that are low in sodium, fat, sugar, and calorie density, while high in vitamins, minerals, and filling fiber. Recommendations include simple and healthy ideas for soups and desserts.   Overview     The Pritikin Solution Program Overview Clinical staff conducted group or individual video education with verbal and written material and guidebook.  Patient learns that the results of the Pritikin Program have been documented in more than 100 articles published in peer-reviewed journals, and the benefits include reducing risk factors for (and, in some cases, even reversing) high cholesterol, high blood pressure, type 2 diabetes, obesity, and more! An overview of the three key pillars of the Pritikin Program will be covered: eating well, doing regular exercise, and having a healthy mind-set.  WORKSHOPS  Exercise: Exercise Basics: Building Your Action Plan Clinical staff led group instruction and group discussion with PowerPoint presentation and patient guidebook. To enhance the learning environment the use of posters, models and videos may be added. At the conclusion of this workshop, patients will comprehend the difference between physical activity and exercise, as well as the benefits of incorporating both, into their routine. Patients will understand the FITT (Frequency, Intensity, Time, and Type) principle and how to use it to build an exercise action plan. In addition, safety concerns and other considerations for exercise and cardiac rehab will be addressed by the presenter. The purpose of this lesson is to promote a comprehensive and effective weekly exercise routine in order to improve patients' overall level of fitness.   Managing Heart Disease: Your Path to a Healthier Heart Clinical staff led group instruction and group discussion with PowerPoint presentation and patient guidebook. To enhance the learning environment the use of posters, models and videos  may be added.At the conclusion of this workshop, patients will understand the anatomy and physiology of the heart. Additionally, they will understand how Pritikin's three pillars impact the risk factors, the progression, and the management of heart disease.  The purpose of this lesson is to provide a high-level overview of the heart, heart disease, and how the Pritikin lifestyle positively impacts risk factors.  Exercise Biomechanics Clinical staff led group instruction and group discussion with PowerPoint presentation and patient guidebook. To enhance the learning environment the use of posters, models and videos may be added. Patients will learn how the structural parts of their bodies function and how these functions impact their daily activities, movement, and exercise. Patients will learn how to promote a neutral spine, learn how to manage pain, and identify ways to improve their physical movement in order to promote healthy living. The purpose of this lesson is to expose patients to common physical limitations that impact physical activity. Participants will learn practical ways to adapt and manage aches and pains, and to minimize their effect on regular exercise. Patients will learn how to maintain good posture while sitting, walking, and lifting.  Balance Training and Fall Prevention  Clinical staff led group instruction and group discussion with PowerPoint presentation and patient guidebook. To enhance the learning environment the use of posters, models and videos may be added. At the conclusion of this workshop, patients will understand  the importance of their sensorimotor skills (vision, proprioception, and the vestibular system) in maintaining their ability to balance as they age. Patients will apply a variety of balancing exercises that are appropriate for their current level of function. Patients will understand the common causes for poor balance, possible solutions to these  problems, and ways to modify their physical environment in order to minimize their fall risk. The purpose of this lesson is to teach patients about the importance of maintaining balance as they age and ways to minimize their risk of falling.  WORKSHOPS   Nutrition:  Fueling a Ship broker led group instruction and group discussion with PowerPoint presentation and patient guidebook. To enhance the learning environment the use of posters, models and videos may be added. Patients will review the foundational principles of the Pritikin Eating Plan and understand what constitutes a serving size in each of the food groups. Patients will also learn Pritikin-friendly foods that are better choices when away from home and review make-ahead meal and snack options. Calorie density will be reviewed and applied to three nutrition priorities: weight maintenance, weight loss, and weight gain. The purpose of this lesson is to reinforce (in a group setting) the key concepts around what patients are recommended to eat and how to apply these guidelines when away from home by planning and selecting Pritikin-friendly options. Patients will understand how calorie density may be adjusted for different weight management goals.  Mindful Eating  Clinical staff led group instruction and group discussion with PowerPoint presentation and patient guidebook. To enhance the learning environment the use of posters, models and videos may be added. Patients will briefly review the concepts of the Pritikin Eating Plan and the importance of low-calorie dense foods. The concept of mindful eating will be introduced as well as the importance of paying attention to internal hunger signals. Triggers for non-hunger eating and techniques for dealing with triggers will be explored. The purpose of this lesson is to provide patients with the opportunity to review the basic principles of the Pritikin Eating Plan, discuss the value of  eating mindfully and how to measure internal cues of hunger and fullness using the Hunger Scale. Patients will also discuss reasons for non-hunger eating and learn strategies to use for controlling emotional eating.  Targeting Your Nutrition Priorities Clinical staff led group instruction and group discussion with PowerPoint presentation and patient guidebook. To enhance the learning environment the use of posters, models and videos may be added. Patients will learn how to determine their genetic susceptibility to disease by reviewing their family history. Patients will gain insight into the importance of diet as part of an overall healthy lifestyle in mitigating the impact of genetics and other environmental insults. The purpose of this lesson is to provide patients with the opportunity to assess their personal nutrition priorities by looking at their family history, their own health history and current risk factors. Patients will also be able to discuss ways of prioritizing and modifying the Pritikin Eating Plan for their highest risk areas  Menu  Clinical staff led group instruction and group discussion with PowerPoint presentation and patient guidebook. To enhance the learning environment the use of posters, models and videos may be added. Using menus brought in from [employer], or printed from [employer], patients will apply the Pritikin dining out guidelines that were presented in the [employer] video. Patients will also be able to practice these guidelines in a variety of provided scenarios. The purpose of  this lesson is to provide patients with the opportunity to practice hands-on learning of the Pritikin Dining Out guidelines with actual menus and practice scenarios.  Label Reading Clinical staff led group instruction and group discussion with PowerPoint presentation and patient guidebook. To enhance the learning environment the use of posters, models and videos may be  added. Patients will review and discuss the Pritikin label reading guidelines presented in Pritikin's Label Reading Educational series video. Using fool labels brought in from local grocery stores and markets, patients will apply the label reading guidelines and determine if the packaged food meet the Pritikin guidelines. The purpose of this lesson is to provide patients with the opportunity to review, discuss, and practice hands-on learning of the Pritikin Label Reading guidelines with actual packaged food labels. Cooking School  Pritikin's [employer] are designed to teach patients ways to prepare quick, simple, and affordable recipes at home. The importance of nutrition's role in chronic disease risk reduction is reflected in its emphasis in the overall Pritikin program. By learning how to prepare essential core Pritikin Eating Plan recipes, patients will increase control over what they eat; be able to customize the flavor of foods without the use of added salt, sugar, or fat; and improve the quality of the food they consume. By learning a set of core recipes which are easily assembled, quickly prepared, and affordable, patients are more likely to prepare more healthy foods at home. These workshops focus on convenient breakfasts, simple entres, side dishes, and desserts which can be prepared with minimal effort and are consistent with nutrition recommendations for cardiovascular risk reduction. Cooking [employer] are taught by a Armed forces logistics/support/administrative officer (RD) who has been trained by the [employer]. The chef or RD has a clear understanding of the importance of minimizing - if not completely eliminating - added fat, sugar, and sodium in recipes. Throughout the series of Cooking School Workshop sessions, patients will learn about healthy ingredients and efficient methods of cooking to build confidence in their capability to prepare    Cooking School weekly topics:  Adding  Flavor- Sodium-Free  Fast and Healthy Breakfasts  Powerhouse Plant-Based Proteins  Satisfying Salads and Dressings  Simple Sides and Sauces  International Cuisine-Spotlight on the [employer] Zones  Delicious Desserts  Savory Soups  [employer] - Meals in a Astronomer Appetizers and Snacks  Comforting Weekend Breakfasts  One-Pot Wonders   Fast Evening Meals  Landscape architect Your Pritikin Plate  WORKSHOPS   Healthy Mindset (Psychosocial):  Focused Goals, Sustainable Changes Clinical staff led group instruction and group discussion with PowerPoint presentation and patient guidebook. To enhance the learning environment the use of posters, models and videos may be added. Patients will be able to apply effective goal setting strategies to establish at least one personal goal, and then take consistent, meaningful action toward that goal. They will learn to identify common barriers to achieving personal goals and develop strategies to overcome them. Patients will also gain an understanding of how our mind-set can impact our ability to achieve goals and the importance of cultivating a positive and growth-oriented mind-set. The purpose of this lesson is to provide patients with a deeper understanding of how to set and achieve personal goals, as well as the tools and strategies needed to overcome common obstacles which may arise along the way.  From Head to Heart: The Power of a Healthy Outlook  Clinical staff led group instruction and group discussion with PowerPoint  presentation and patient guidebook. To enhance the learning environment the use of posters, models and videos may be added. Patients will be able to recognize and describe the impact of emotions and mood on physical health. They will discover the importance of self-care and explore self-care practices which may work for them. Patients will also learn how to utilize the 4 C's to cultivate a healthier outlook and better  manage stress and challenges. The purpose of this lesson is to demonstrate to patients how a healthy outlook is an essential part of maintaining good health, especially as they continue their cardiac rehab journey.  Healthy Sleep for a Healthy Heart Clinical staff led group instruction and group discussion with PowerPoint presentation and patient guidebook. To enhance the learning environment the use of posters, models and videos may be added. At the conclusion of this workshop, patients will be able to demonstrate knowledge of the importance of sleep to overall health, well-being, and quality of life. They will understand the symptoms of, and treatments for, common sleep disorders. Patients will also be able to identify daytime and nighttime behaviors which impact sleep, and they will be able to apply these tools to help manage sleep-related challenges. The purpose of this lesson is to provide patients with a general overview of sleep and outline the importance of quality sleep. Patients will learn about a few of the most common sleep disorders. Patients will also be introduced to the concept of "sleep hygiene," and discover ways to self-manage certain sleeping problems through simple daily behavior changes. Finally, the workshop will motivate patients by clarifying the links between quality sleep and their goals of heart-healthy living.   Recognizing and Reducing Stress Clinical staff led group instruction and group discussion with PowerPoint presentation and patient guidebook. To enhance the learning environment the use of posters, models and videos may be added. At the conclusion of this workshop, patients will be able to understand the types of stress reactions, differentiate between acute and chronic stress, and recognize the impact that chronic stress has on their health. They will also be able to apply different coping mechanisms, such as reframing negative self-talk. Patients will have the opportunity  to practice a variety of stress management techniques, such as deep abdominal breathing, progressive muscle relaxation, and/or guided imagery.  The purpose of this lesson is to educate patients on the role of stress in their lives and to provide healthy techniques for coping with it.  Learning Barriers/Preferences:  Learning Barriers/Preferences - 09/05/22 0951       Learning Barriers/Preferences   Learning Barriers None    Learning Preferences Video;Skilled Demonstration             Education Topics:  Knowledge Questionnaire Score:  Knowledge Questionnaire Score - 09/05/22 1004       Knowledge Questionnaire Score   Pre Score 18/24             Core Components/Risk Factors/Patient Goals at Admission:  Personal Goals and Risk Factors at Admission - 09/05/22 1011       Core Components/Risk Factors/Patient Goals on Admission    Weight Management Yes;Obesity;Weight Loss    Intervention Weight Management/Obesity: Establish reasonable short term and long term weight goals.;Obesity: Provide education and appropriate resources to help participant work on and attain dietary goals.    Admit Weight 227 lb 1.2 oz (103 kg)    Expected Outcomes Short Term: Continue to assess and modify interventions until short term weight is achieved;Long Term: Adherence to nutrition and physical  activity/exercise program aimed toward attainment of established weight goal;Weight Loss: Understanding of general recommendations for a balanced deficit meal plan, which promotes 1-2 lb weight loss per week and includes a negative energy balance of [phone] kcal/d    Hypertension Yes    Intervention Provide education on lifestyle modifcations including regular physical activity/exercise, weight management, moderate sodium restriction and increased consumption of fresh fruit, vegetables, and low fat dairy, alcohol moderation, and smoking cessation.;Monitor prescription use compliance.    Expected Outcomes Short  Term: Continued assessment and intervention until BP is < 140/102mm HG in hypertensive participants. < 130/72mm HG in hypertensive participants with diabetes, heart failure or chronic kidney disease.;Long Term: Maintenance of blood pressure at goal levels.    Lipids Yes    Intervention Provide education and support for participant on nutrition & aerobic/resistive exercise along with prescribed medications to achieve LDL 70mg , HDL >40mg .    Expected Outcomes Short Term: Participant states understanding of desired cholesterol values and is compliant with medications prescribed. Participant is following exercise prescription and nutrition guidelines.;Long Term: Cholesterol controlled with medications as prescribed, with individualized exercise RX and with personalized nutrition plan. Value goals: LDL < 70mg , HDL > 40 mg.             Core Components/Risk Factors/Patient Goals Review:   Goals and Risk Factor Review     Row Name 10/10/22 0845 11/03/22 0928 12/05/22 0829         Core Components/Risk Factors/Patient Goals Review   Personal Goals Review Weight Management/Obesity;Hypertension;Lipids Weight Management/Obesity;Hypertension;Lipids Weight Management/Obesity;Hypertension;Lipids     Review Brian Elliott has been doing well with exercise at cardiac rehab. vital signs have been stable. Brian Elliott's last day of attendance was on 09/27/22 as Brian Elliott is currently out of town Brian Elliott has been doing well with exercise at cardiac rehab. vital signs have been stable. Brian Elliott has lost 4.7 kg since starting the program. Brian Elliott's llipid levels have improved. Brian Elliott will complete cardiac rehab on 11/17/22 Brian Elliott has been doing well with exercise at cardiac rehab. vital signs have been stable. Winn has lost 7.0 kg since starting the program. Brian Elliott's llipid levels have improved. Brian Elliott will complete cardiac rehab on 12/15/22     Expected Outcomes Brian Elliott will continue to participate in cardiac rehab for  exercise, nutrition and lifestyle modifications Brian Elliott will continue to participate in cardiac rehab for exercise, nutrition and lifestyle modifications Brian Elliott will continue to participate in cardiac rehab for exercise, nutrition and lifestyle modifications              Core Components/Risk Factors/Patient Goals at Discharge (Final Review):   Goals and Risk Factor Review - 12/05/22 0829       Core Components/Risk Factors/Patient Goals Review   Personal Goals Review Weight Management/Obesity;Hypertension;Lipids    Review Shanna has been doing well with exercise at cardiac rehab. vital signs have been stable. Torin has lost 7.0 kg since starting the program. Denzell's llipid levels have improved. Leviticus will complete cardiac rehab on 12/15/22    Expected Outcomes Edward will continue to participate in cardiac rehab for exercise, nutrition and lifestyle modifications             ITP Comments:  ITP Comments     Row Name 09/05/22 0800 09/11/22 1327 10/10/22 0843 11/03/22 0923 12/05/22 [id number]   ITP Comments Medical Director- Dr. Armanda Magic, MD. Introduction to the [facility] / Intensive Cardiac Rehab. Reviewed orientation folder with patient. 30- day ITP review. Mateen started cardiac rehab on 09/11/2022 and did well  with exercise. Dr. Armanda Magic is medical director. 30 Day ITP Review. Eidan has good participaiton when in attendance at cardiac rehab. Tequan is currently out of town 30 Day ITP Review. Jlynn has good participaiton when in attendance at cardiac rehab. Dreydon will complete cardiac rehab on 11/17/22 30 Day ITP Review. Turner has good participaiton when in attendance at cardiac rehab. Eddie will complete cardiac rehab on 12/15/22            Comments: See ITP Comments.Thayer Headings RN BSN

## 2022-12-06 ENCOUNTER — Encounter (HOSPITAL_COMMUNITY)
Admission: RE | Admit: 2022-12-06 | Discharge: 2022-12-06 | Disposition: A | Discharge status: Home / Self Care | Payer: 59 | Source: Ambulatory Visit | Attending: Cardiology | Admitting: Cardiology

## 2022-12-06 DIAGNOSIS — Z955 Presence of coronary angioplasty implant and graft: Secondary | ICD-10-CM

## 2022-12-08 ENCOUNTER — Encounter (HOSPITAL_COMMUNITY)
Admission: RE | Admit: 2022-12-08 | Discharge: 2022-12-08 | Disposition: A | Discharge status: Home / Self Care | Payer: 59 | Source: Ambulatory Visit | Attending: Cardiology

## 2022-12-08 DIAGNOSIS — Z955 Presence of coronary angioplasty implant and graft: Secondary | ICD-10-CM | POA: Diagnosis not present

## 2022-12-11 ENCOUNTER — Encounter (HOSPITAL_COMMUNITY)
Admission: RE | Admit: 2022-12-11 | Discharge: 2022-12-11 | Disposition: A | Discharge status: Home / Self Care | Payer: 59 | Source: Ambulatory Visit | Attending: Cardiology | Admitting: Cardiology

## 2022-12-11 DIAGNOSIS — Z955 Presence of coronary angioplasty implant and graft: Secondary | ICD-10-CM | POA: Diagnosis not present

## 2022-12-13 ENCOUNTER — Encounter (HOSPITAL_COMMUNITY)
Admission: RE | Admit: 2022-12-13 | Discharge: 2022-12-13 | Disposition: A | Discharge status: Home / Self Care | Payer: 59 | Source: Ambulatory Visit | Attending: Cardiology | Admitting: Cardiology

## 2022-12-13 VITALS — Ht 70.0 in | Wt 209.4 lb

## 2022-12-13 DIAGNOSIS — I2510 Atherosclerotic heart disease of native coronary artery without angina pectoris: Secondary | ICD-10-CM | POA: Insufficient documentation

## 2022-12-13 DIAGNOSIS — Z955 Presence of coronary angioplasty implant and graft: Secondary | ICD-10-CM | POA: Insufficient documentation

## 2022-12-15 ENCOUNTER — Encounter (HOSPITAL_COMMUNITY)
Admission: RE | Admit: 2022-12-15 | Discharge: 2022-12-15 | Disposition: A | Discharge status: Home / Self Care | Payer: 59 | Source: Ambulatory Visit | Attending: Cardiology

## 2022-12-15 DIAGNOSIS — Z955 Presence of coronary angioplasty implant and graft: Secondary | ICD-10-CM | POA: Diagnosis not present

## 2022-12-18 NOTE — Progress Notes (Signed)
Discharge Progress Report  Patient Details  Name: Brian Elliott MRN: [id number] Date of Birth: February 24, 1966 Referring Provider:   Flowsheet Row INTENSIVE CARDIAC REHAB ORIENT from 09/05/2022 in [facility]  Referring Provider Tessa Lerner, DO        Number of Visits: 34 visits           34 exercise sessions, 32 education sessions  Reason for Discharge:  Patient reached a stable level of exercise. Patient independent in their exercise. Patient has met program and personal goals.  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, pLAD  ADL UCSD:   Initial Exercise Prescription:  Initial Exercise Prescription - 09/05/22 1000       Date of Initial Exercise RX and Referring Provider   Date 09/05/22    Referring Provider Tessa Lerner, DO    Expected Discharge Date 11/17/22      Bike   Level 2    Watts 50    METs 3.8      NuStep   Level 3    SPM 85    Minutes 15    METs 3.2      Prescription Details   Frequency (times per week) 3    Duration Progress to 30 minutes of continuous aerobic without signs/symptoms of physical distress      Intensity   THRR 40-80% of Max Heartrate 65-130    Ratings of Perceived Exertion 11-13    Perceived Dyspnea 0-4      Progression   Progression Continue to progress workloads to maintain intensity without signs/symptoms of physical distress.      Resistance Training   Training Prescription Yes    Weight 5 lbs    Reps 10-15             Discharge Exercise Prescription (Final Exercise Prescription Changes):  Exercise Prescription Changes - 12/15/22 0837        Response to Exercise   Blood Pressure (Admit) 98/62    Blood Pressure (Exercise) 140/64    Blood Pressure (Exit) 108/76    Heart Rate (Admit) 64 bpm    Heart Rate (Exercise) 113 bpm    Heart Rate (Exit) 74 bpm    Rating of Perceived Exertion (Exercise) 11    Perceived Dyspnea (Exercise) 0    Symptoms 0    Comments Pt graduated the [employer] program    Duration Progress to 30 minutes of  aerobic without signs/symptoms of physical distress    Intensity THRR unchanged      Progression   Progression Continue to progress workloads to maintain intensity without signs/symptoms of physical distress.    Average METs 5.19      Resistance Training   Training Prescription Yes    Weight 7 lbs wts    Reps 10-15    Time 10 Minutes      Treadmill   MPH 3.8    Grade 3    Minutes 15    METs 5.48      NuStep   Level 6    SPM 120    Minutes  15    METs 4.9      Home Exercise Plan   Plans to continue exercise at Home (comment)    Frequency Add 4 additional days to program exercise sessions.    Initial Home Exercises Provided 09/27/22             Functional Capacity:  6 Minute Walk     Row Name 09/05/22 0917 12/13/22 1411       6 Minute Walk   Phase Initial Discharge    Distance 1872 feet 1926 feet    Distance % Change -- 2.88 %    Distance Feet Change -- 54 ft    Walk Time 6 minutes 6 minutes    # of Rest Breaks 0 0    MPH 3.54 2.65    METS 4.12 4.34    RPE 9 9    Perceived Dyspnea  0 0    VO2 Peak 14.43 15.19    Symptoms No No    Resting HR 81 bpm 58 bpm    Resting BP 108/70 100/60    Resting Oxygen Saturation  97 % --    Exercise Oxygen Saturation  during 6 min walk 98 % --    Max Ex. HR 100 bpm 92 bpm    Max Ex. BP 110/64 122/64    2 Minute Post BP 108/72 106/62             Psychological, QOL, Others - Outcomes: PHQ 2/9:    12/13/2022   10:07 AM 09/05/2022   10:05 AM  Depression screen PHQ 2/9  Decreased Interest 0 0  Down, Depressed,  Hopeless 0 0  PHQ - 2 Score 0 0  Altered sleeping 0 0  Tired, decreased energy 0 0  Change in appetite 0 0  Feeling bad or failure about yourself  0 0  Trouble concentrating 0 0  Moving slowly or fidgety/restless 0 0  Suicidal thoughts 0 0  PHQ-9 Score 0 0    Quality of Life:  Quality of Life - 12/13/22 1420       Quality of Life   Select Quality of Life      Quality of Life Scores   Health/Function Post 28.8 %    Socioeconomic Post 30 %    Psych/Spiritual Post 28.29 %    Family Post 30 %    GLOBAL Post 29.12 %             Personal Goals: Goals established at orientation with interventions provided to work toward goal.  Personal Goals and Risk Factors at Admission - 09/05/22 1011       Core Components/Risk Factors/Patient Goals on Admission    Weight Management Yes;Obesity;Weight Loss    Intervention Weight Management/Obesity: Establish reasonable short term and long term weight goals.;Obesity: Provide education and appropriate resources to help participant work on and attain dietary goals.    Admit Weight 227 lb 1.2 oz (103 kg)    Expected Outcomes Short Term: Continue to assess and modify interventions until short term weight is achieved;Long Term: Adherence to nutrition and physical activity/exercise program aimed toward attainment of established weight goal;Weight Loss: Understanding of general recommendations for a balanced deficit meal plan, which promotes 1-2 lb weight loss per week and includes a negative energy balance of [phone] kcal/d    Hypertension Yes    Intervention Provide education on lifestyle modifcations including regular physical activity/exercise, weight management, moderate sodium restriction and increased consumption of fresh fruit, vegetables, and low  fat dairy, alcohol moderation, and smoking cessation.;Monitor prescription use compliance.    Expected Outcomes Short Term: Continued assessment and intervention until BP is < 140/48mm HG in  hypertensive participants. < 130/45mm HG in hypertensive participants with diabetes, heart failure or chronic kidney disease.;Long Term: Maintenance of blood pressure at goal levels.    Lipids Yes    Intervention Provide education and support for participant on nutrition & aerobic/resistive exercise along with prescribed medications to achieve LDL 70mg , HDL >40mg .    Expected Outcomes Short Term: Participant states understanding of desired cholesterol values and is compliant with medications prescribed. Participant is following exercise prescription and nutrition guidelines.;Long Term: Cholesterol controlled with medications as prescribed, with individualized exercise RX and with personalized nutrition plan. Value goals: LDL < 70mg , HDL > 40 mg.              Personal Goals Discharge:  Goals and Risk Factor Review     Row Name 10/10/22 0845 11/03/22 8657 12/05/22 0829         Core Components/Risk Factors/Patient Goals Review   Personal Goals Review Weight Management/Obesity;Hypertension;Lipids Weight Management/Obesity;Hypertension;Lipids Weight Management/Obesity;Hypertension;Lipids     Review Ein has been doing well with exercise at cardiac rehab. vital signs have been stable. Gregery's last day of attendance was on 09/27/22 as Jayan is currently out of town Keyontae has been doing well with exercise at cardiac rehab. vital signs have been stable. Mickell has lost 4.7 kg since starting the program. Jackie's llipid levels have improved. Ismail will complete cardiac rehab on 11/17/22 Adar has been doing well with exercise at cardiac rehab. vital signs have been stable. Luisangel has lost 7.0 kg since starting the program. Fitzgerald's llipid levels have improved. Chatham will complete cardiac rehab on 12/15/22     Expected Outcomes Esias will continue to participate in cardiac rehab for exercise, nutrition and lifestyle modifications Dayle will continue to participate in cardiac rehab  for exercise, nutrition and lifestyle modifications Amram will continue to participate in cardiac rehab for exercise, nutrition and lifestyle modifications              Exercise Goals and Review:  Exercise Goals     Row Name 09/05/22 0855             Exercise Goals   Increase Physical Activity Yes       Intervention Provide advice, education, support and counseling about physical activity/exercise needs.;Develop an individualized exercise prescription for aerobic and resistive training based on initial evaluation findings, risk stratification, comorbidities and participant's personal goals.       Expected Outcomes Short Term: Attend rehab on a regular basis to increase amount of physical activity.;Long Term: Exercising regularly at least 3-5 days a week.;Long Term: Add in home exercise to make exercise part of routine and to increase amount of physical activity.       Increase Strength and Stamina Yes       Intervention Provide advice, education, support and counseling about physical activity/exercise needs.;Develop an individualized exercise prescription for aerobic and resistive training based on initial evaluation findings, risk stratification, comorbidities and participant's personal goals.       Expected Outcomes Short Term: Increase workloads from initial exercise prescription for resistance, speed, and METs.;Short Term: Perform resistance training exercises routinely during rehab and add in resistance training at home;Long Term: Improve cardiorespiratory fitness, muscular endurance and strength as measured by increased METs and functional capacity ( )       Able to understand and use rate of  perceived exertion (RPE) scale Yes       Intervention Provide education and explanation on how to use RPE scale       Expected Outcomes Short Term: Able to use RPE daily in rehab to express subjective intensity level;Long Term:  Able to use RPE to guide intensity level when exercising  independently       Knowledge and understanding of Target Heart Rate Range (THRR) Yes       Intervention Provide education and explanation of THRR including how the numbers were predicted and where they are located for reference       Expected Outcomes Short Term: Able to state/look up THRR;Short Term: Able to use daily as guideline for intensity in rehab;Long Term: Able to use THRR to govern intensity when exercising independently       Able to check pulse independently Yes       Intervention Provide education and demonstration on how to check pulse in carotid and radial arteries.;Review the importance of being able to check your own pulse for safety during independent exercise       Expected Outcomes Short Term: Able to explain why pulse checking is important during independent exercise;Long Term: Able to check pulse independently and accurately       Understanding of Exercise Prescription Yes       Intervention Provide education, explanation, and written materials on patient's individual exercise prescription       Expected Outcomes Short Term: Able to explain program exercise prescription;Long Term: Able to explain home exercise prescription to exercise independently                Exercise Goals Re-Evaluation:  Exercise Goals Re-Evaluation     Row Name 09/11/22 0847 09/27/22 0827 10/25/22 0840 12/06/22 0818 12/15/22 0839     Exercise Goal Re-Evaluation   Exercise Goals Review Increase Physical Activity;Understanding of Exercise Prescription;Increase Strength and Stamina;Knowledge and understanding of Target Heart Rate Range (THRR);Able to understand and use rate of perceived exertion (RPE) scale Increase Physical Activity;Understanding of Exercise Prescription;Increase Strength and Stamina;Knowledge and understanding of Target Heart Rate Range (THRR);Able to understand and use rate of perceived exertion (RPE) scale Increase Physical Activity;Understanding of Exercise Prescription;Increase  Strength and Stamina;Knowledge and understanding of Target Heart Rate Range (THRR);Able to understand and use rate of perceived exertion (RPE) scale Increase Physical Activity;Understanding of Exercise Prescription;Increase Strength and Stamina;Knowledge and understanding of Target Heart Rate Range (THRR);Able to understand and use rate of perceived exertion (RPE) scale Increase Physical Activity;Understanding of Exercise Prescription;Increase Strength and Stamina;Knowledge and understanding of Target Heart Rate Range (THRR);Able to understand and use rate of perceived exertion (RPE) scale   Comments Pt first day in the [facility] program. Pt tolerated exercise well with an average MET level of 2.5. Pt is off to a good start and is learning his THRR, RPE and ExRx. REVD MET's, goals and home ExRx. Pt tolerated exercise well with an average MET level of 3.35. Pt is doing well and progressing his MET's, he INC [employer] on both stations today. Pt feels good about his goals of getting into a regular Ex routine and eating a more heart healthy diet. His family is very supportive. He will continue to exercise bywalking, using his treadmill, stationary bike and handweights 2-4 days for 30-45 mins REVD MET's and goals. Pt tolerated exercise well with an average MET level of 4.4. Pt feels good about his goals and is increasing strength and stamina. He is walking more and he just  got an e-bike that he's enjoying. REVD MET's and goals. Pt tolerated exercise well with an average MET level of 5.54. Pt feels good about his goals and is progressing very well with his MET's. He has been able to recieve a lot of materials from the RD and feels good about his progress Pt graduated the [employer]. Pt tolerated exercise well with an average MET level of 5.19. Pt did very well in the program and increased his distance on his post . He also had very successful weight loss of over 17 lbs and a decrease in BF % from 31.5% to 28.3%. He  will continue to exercise on his own at home by using his bike, treadmill, handweights, and walking 5-7 days for 30-45 mins per session. He will let me know later if he is interested in [facility]. but is planning on continueing exercise at home for now.   Expected Outcomes Will continue to monitor pt and progress workloads as tolerated without sign or symptom Will continue to monitor pt and progress workloads as tolerated without sign or symptom Will continue to monitor pt and progress workloads as tolerated without sign or symptom Will continue to monitor pt and progress workloads as tolerated without sign or symptom Pt will continue to exercise on his own and gain strength            Nutrition & Weight - Outcomes:  Pre Biometrics - 09/05/22 0800       Pre Biometrics   Waist Circumference 45.75 inches    Hip Circumference 42 inches    Waist to Hip Ratio 1.09 %    Triceps Skinfold 15 mm    % Body Fat 31.5 %    Grip Strength 58 kg    Flexibility 0 in    Single Leg Stand 28.68 seconds             Post Biometrics - 12/13/22 1416        Post  Biometrics   Height 5\' 10"  (1.778 m)    Weight 95 kg    Waist Circumference 41.5 inches    Hip Circumference 40.25 inches    Waist to Hip Ratio 1.03 %    BMI (Calculated) 30.05    Triceps Skinfold 14 mm    % Body Fat 28.3 %    Grip Strength 47 kg    Flexibility 7 in    Single Leg Stand 30 seconds             Nutrition:  Nutrition Therapy & Goals - 12/07/22 1147       Nutrition Therapy   Diet Heart Healthy Diet    Drug/Food Interactions Statins/Certain Fruits      Personal Nutrition Goals   Nutrition Goal Patient to identify strategies for reducing cardiovascular risk by attending the [facility] education and nutrition series weekly.   goal in action.   Personal Goal #2 Patient to improve diet quality by using the plate method as a guide for meal planning to include lean protein/plant protein, fruits, vegetables, whole  grains, nonfat dairy as part of a well-balanced diet.   goal in action.   Personal Goal #3 Patient to limit sodium to 1500mg  per day   goal in action.   Comments Goals in action. Cinsere continues to attend the [employer] and nutrition series regularly. He had started to make dietary changes including reduced sodium intake, reduced saturated fat intake, and increased dietary fiber. He is down 15.4# since starting  with our program. His LDL has improved to <55. Most recent A1c remains in a prediabetic range; expect continued improvement with dietary changes, weight loss, and exercise. Patient will benefit from participation in intensive cardiac rehab for nutrition, exercise, and lifestyle modification.      Intervention Plan   Intervention Prescribe, educate and counsel regarding individualized specific dietary modifications aiming towards targeted core components such as weight, hypertension, lipid management, diabetes, heart failure and other comorbidities.;Nutrition handout(s) given to patient.    Expected Outcomes Short Term Goal: Understand basic principles of dietary content, such as calories, fat, sodium, cholesterol and nutrients.;Long Term Goal: Adherence to prescribed nutrition plan.             Nutrition Discharge:  Nutrition Assessments - 12/15/22 1132       Rate Your Plate Scores   Pre Score 52    Post Score 79             Education Questionnaire Score:  Knowledge Questionnaire Score - 12/13/22 1421       Knowledge Questionnaire Score   Post Score 20/24             Goals reviewed with patient; copy given to patient. Pt graduated from cardiac rehab program 12/15/22 with completion of 34 exercise sessions and 32 education sessions. Pt maintained good attendance and progressed nicely during his participation in rehab as evidenced by increased MET level and weight loss.   Medication list reconciled. Repeat  PHQ9 score=0.  Pt has made significant lifestyle  changes and should be commended for his success. Delwyn achieved his goals during cardiac rehab. He plans to continue his home exercise regimen and considering [employer] [facility] program.

## 2022-12-25 ENCOUNTER — Other Ambulatory Visit: Payer: Self-pay | Admitting: Cardiology

## 2023-01-24 ENCOUNTER — Other Ambulatory Visit: Payer: Self-pay | Admitting: Cardiology

## 2023-03-05 ENCOUNTER — Emergency Department (HOSPITAL_COMMUNITY)
Admission: EM | Admit: 2023-03-05 | Discharge: 2023-03-05 | Disposition: A | Discharge status: Home / Self Care | Payer: 59 | Attending: Emergency Medicine | Admitting: Emergency Medicine

## 2023-03-05 ENCOUNTER — Emergency Department (HOSPITAL_COMMUNITY): Payer: 59

## 2023-03-05 ENCOUNTER — Other Ambulatory Visit: Payer: Self-pay

## 2023-03-05 ENCOUNTER — Encounter (HOSPITAL_COMMUNITY): Payer: Self-pay

## 2023-03-05 DIAGNOSIS — R079 Chest pain, unspecified: Secondary | ICD-10-CM | POA: Insufficient documentation

## 2023-03-05 DIAGNOSIS — I2510 Atherosclerotic heart disease of native coronary artery without angina pectoris: Secondary | ICD-10-CM | POA: Insufficient documentation

## 2023-03-05 DIAGNOSIS — R0981 Nasal congestion: Secondary | ICD-10-CM | POA: Diagnosis not present

## 2023-03-05 DIAGNOSIS — Z79899 Other long term (current) drug therapy: Secondary | ICD-10-CM | POA: Diagnosis not present

## 2023-03-05 DIAGNOSIS — M25512 Pain in left shoulder: Secondary | ICD-10-CM | POA: Diagnosis not present

## 2023-03-05 DIAGNOSIS — E039 Hypothyroidism, unspecified: Secondary | ICD-10-CM | POA: Diagnosis not present

## 2023-03-05 DIAGNOSIS — Z79890 Hormone replacement therapy (postmenopausal): Secondary | ICD-10-CM | POA: Insufficient documentation

## 2023-03-05 LAB — CBC
HCT: 40.8 % (ref 39.0–52.0)
Hemoglobin: 13.7 g/dL (ref 13.0–17.0)
MCH: 31.2 pg (ref 26.0–34.0)
MCHC: 33.6 g/dL (ref 30.0–36.0)
MCV: 92.9 fL (ref 80.0–100.0)
Platelets: 234 10*3/uL (ref 150–400)
RBC: 4.39 MIL/uL (ref 4.22–5.81)
RDW: 13.3 % (ref 11.5–15.5)
WBC: 7.6 10*3/uL (ref 4.0–10.5)
nRBC: 0 % (ref 0.0–0.2)

## 2023-03-05 LAB — BASIC METABOLIC PANEL
Anion gap: 8 (ref 5–15)
BUN: 15 mg/dL (ref 6–20)
CO2: 27 mmol/L (ref 22–32)
Calcium: 10 mg/dL (ref 8.9–10.3)
Chloride: 103 mmol/L (ref 98–111)
Creatinine, Ser: 1.01 mg/dL (ref 0.61–1.24)
GFR, Estimated: >60 mL/min (ref >60)
Glucose, Bld: 120 mg/dL — ABNORMAL HIGH (ref 70–99)
Potassium: 4.8 mmol/L (ref 3.5–5.1)
Sodium: 138 mmol/L (ref 135–145)

## 2023-03-05 LAB — TROPONIN I (HIGH SENSITIVITY)
Troponin I (High Sensitivity): 3 ng/L (ref <18)
Troponin I (High Sensitivity): 4 ng/L (ref <18)

## 2023-03-05 MED ORDER — ONDANSETRON 4 MG PO TBDP
4.0000 mg | ORAL_TABLET | Freq: Once | ORAL | Status: AC | PRN
  Administered 2023-03-05: 4 mg via ORAL
  Filled 2023-03-05: qty 1

## 2023-03-05 NOTE — ED Triage Notes (Signed)
Pt c/o left shoulder pain and chest "pressure" and nausea that started this morning. Pt states he had the shoulder pain 3 days ago and it went away and then came back worse today. Pt denies shortness of breath or vomiting.

## 2023-03-05 NOTE — ED Provider Notes (Signed)
[place] EMERGENCY DEPARTMENT AT [facility] Provider Note   CSN: [id number] Arrival date & time: 03/05/23  1258     History  Chief Complaint  Patient presents with   Chest Pain    Brian Elliott is a 57 y.o. male.   Chest Pain Patient's had some nasal congestion and felt bad for the last few days.  Had some left shoulder pain.  Worse with certain movements.  Today had nausea and worsening pain in his left chest.  States he has been feeling bad.  No vomiting.  No definite sick contacts.  History of coronary artery disease with stent but states this felt different.    Past Medical History:  Diagnosis Date   Allergy    seasonal   Coronary artery disease    GERD (gastroesophageal reflux disease)    Hyperlipidemia    Hypothyroid    Sleep apnea    wears cpap    Home Medications Prior to Admission medications   Medication Sig Start Date End Date Taking? Authorizing Provider  acetaminophen (TYLENOL) 500 MG tablet Take 500 mg by mouth every 6 (six) hours as needed for moderate pain.    [provider]  atorvastatin (LIPITOR) 40 MG tablet TAKE 1 TABLET BY MOUTH EVERY DAY 12/25/22   Tolia, Sunit, DO  BRILINTA 90 MG TABS tablet TAKE 1 TABLET BY MOUTH 2 TIMES DAILY. 01/24/23   Tolia, Sunit, DO  [facility] ASPIRIN LOW DOSE 81 MG tablet Take 81 mg by mouth daily. 07/27/22   [provider]  ezetimibe (ZETIA) 10 MG tablet Take 1 tablet (10 mg total) by mouth daily. 08/22/22 11/20/22  Tolia, Sunit, DO  famotidine (PEPCID) 20 MG tablet Take 20 mg by mouth daily. 07/11/22   [provider]  fexofenadine (ALLEGRA) 180 MG tablet Take 180 mg by mouth daily.    [provider]  isosorbide mononitrate (IMDUR) 30 MG 24 hr tablet Take 1 tablet (30 mg total) by mouth at bedtime. 08/09/22 11/07/22  Custovic, Rozell Searing, DO  levothyroxine (SYNTHROID) 150 MCG tablet Take 150 mcg by mouth daily. 09/05/19   [provider]  losartan (COZAAR) 25 MG tablet Take  0.5 tablets (12.5 mg total) by mouth at bedtime. 08/09/22 11/07/22  Custovic, Rozell Searing, DO  MAGNESIUM PO Take 200 mg by mouth in the morning and at bedtime.    [provider]  metoprolol succinate (TOPROL-XL) 25 MG 24 hr tablet TAKE 1 TABLET BY MOUTH DAILY. TAKE WITH OR IMMEDIATELY FOLLOWING A MEAL. 12/25/22   Tolia, Sunit, DO  Multiple Vitamin (MULTIVITAMIN) tablet Take 1 tablet by mouth daily.    [provider]  nitroGLYCERIN (NITROSTAT) 0.4 MG SL tablet Place 0.4 mg under the tongue every 5 (five) minutes as needed for chest pain.    [provider]  Omega-3 Fatty Acids (FISH OIL PO) Take 1 capsule by mouth daily.    [provider]  sertraline (ZOLOFT) 25 MG tablet Take 1 tablet (25 mg total) by mouth daily. 08/09/22   Custovic, Rozell Searing, DO  vitamin E 180 MG (400 UNITS) capsule Take 400 Units by mouth daily.    [provider]  zinc gluconate 50 MG tablet Take 50 mg by mouth in the morning and at bedtime.    [provider]      Allergies    Patient has no known allergies.    Review of Systems   Review of Systems  Cardiovascular:  Positive for chest pain.  Physical Exam Updated Vital Signs BP (!) 136/97   Pulse (!) 59   Temp 98 F (36.7 C) (Oral)   Resp 14   Ht 5\' 9"  (1.753 m)   Wt 92.1 kg   SpO2 100%   BMI 29.98 kg/m  Physical Exam Vitals and nursing note reviewed.  HENT:     Head: Atraumatic.  Cardiovascular:     Rate and Rhythm: Regular rhythm.  Pulmonary:     Breath sounds: No decreased breath sounds or wheezing.  Chest:     Chest wall: No tenderness.  Musculoskeletal:     Right lower leg: No edema.     Left lower leg: No edema.  Skin:    General: Skin is warm.  Neurological:     Mental Status: He is alert.     ED Results / Procedures / Treatments   Labs (all labs ordered are listed, but only abnormal results are displayed) Labs Reviewed  BASIC METABOLIC PANEL - Abnormal; Notable for the following  components:      Result Value   Glucose, Bld 120 (*)    All other components within normal limits  CBC  TROPONIN I (HIGH SENSITIVITY)  TROPONIN I (HIGH SENSITIVITY)    EKG EKG Interpretation Date/Time:  Monday March 05 2023 13:13:09 EST Ventricular Rate:  61 PR Interval:  156 QRS Duration:  96 QT Interval:  378 QTC Calculation: 380 R Axis:   58  Text Interpretation: Normal sinus rhythm Normal ECG Confirmed by Benjiman Core [phone]) on 03/05/2023 4:51:01 PM  Radiology DG Chest 2 View Result Date: 03/05/2023 CLINICAL DATA:  Chest pain. EXAM: CHEST - 2 VIEW COMPARISON:  Aug 08, 2022. FINDINGS: The heart size and mediastinal contours are within normal limits. Both lungs are clear. The visualized skeletal structures are unremarkable. IMPRESSION: No active cardiopulmonary disease. Electronically Signed   By: Lupita Raider M.D.   On: 03/05/2023 14:21    Procedures Procedures    Medications Ordered in ED Medications  ondansetron (ZOFRAN-ODT) disintegrating tablet 4 mg (4 mg Oral Given 03/05/23 1319)    ED Course/ Medical Decision Making/ A&P                                 Medical Decision Making Amount and/or Complexity of Data Reviewed Labs: ordered. Radiology: ordered.  Risk Prescription drug management.   Patient with nasal congestion feeling red.  Now some left shoulder and chest pain.  Previous cardiac history.  Reviewed previous stent.  Chest x-ray reassuring.  Blood work reassuring.  First troponin negative.  Will get second troponin since pain worsened this afternoon.  I think likely discharge home.  Likely viral type syndrome.  Second troponin negative.  Doubt cardiac ischemia.  X-ray does not show pneumonia.  Doubt pulmonary embolism.  Appears stable for discharge home.        Final Clinical Impression(s) / ED Diagnoses Final diagnoses:  None    Rx / DC Orders ED Discharge Orders     None         Benjiman Core, MD 03/05/23  1843

## 2023-03-05 NOTE — ED Notes (Signed)
Patient verbalizes understanding of discharge instructions. Opportunity for questioning and answers were provided. Armband removed by staff, pt discharged from ED. Pt ambulatory to ED waiting room with steady gait.  

## 2023-03-15 ENCOUNTER — Encounter: Payer: Self-pay | Admitting: Cardiology

## 2023-03-15 ENCOUNTER — Encounter: Payer: 59 | Attending: Cardiology | Admitting: Cardiology

## 2023-03-15 ENCOUNTER — Ambulatory Visit: Payer: 59 | Attending: Cardiology | Admitting: Cardiology

## 2023-03-15 VITALS — BP 116/80 | HR 64 | Resp 16 | Ht 69.0 in | Wt 214.0 lb

## 2023-03-15 DIAGNOSIS — I2510 Atherosclerotic heart disease of native coronary artery without angina pectoris: Secondary | ICD-10-CM | POA: Insufficient documentation

## 2023-03-15 DIAGNOSIS — Z955 Presence of coronary angioplasty implant and graft: Secondary | ICD-10-CM | POA: Diagnosis not present

## 2023-03-15 DIAGNOSIS — I10 Essential (primary) hypertension: Secondary | ICD-10-CM | POA: Insufficient documentation

## 2023-03-15 DIAGNOSIS — E782 Mixed hyperlipidemia: Secondary | ICD-10-CM | POA: Insufficient documentation

## 2023-03-15 NOTE — Progress Notes (Signed)
 Cardiology Office Note:  .   Date:  03/15/2023  ID:  Brian Elliott, DOB 12-Mar-1966, MRN [id number] PCP:  Valentin Skates, DO  Former Cardiology Providers: N/A [facility] Providers Cardiologist:  Madonna Large, DO , [facility] (established care Aug 03, 2022) Electrophysiologist:  None  Click to update primary MD,subspecialty MD or APP then REFRESH:1}    Chief Complaint  Patient presents with   Atherosclerosis of native coronary artery of native heart w   Follow-up    History of Present Illness: .   Brian Elliott is a 58 y.o. Caucasian male whose past medical history and cardiovascular risk factors includes: CAD status post angioplasty and stenting to the proximal/mid LAD, Hypothyroidism, hyperlipidemia, OSA status post UP3 and on CPAP, GERD, erectile dysfunction, former smoker.   In May 2024 patient presented to the office with chest pain and hyperlipidemia.  His symptoms were very concerning for unstable angina.  He was admitted electively and underwent angiography and noted to have obstructive disease and underwent intervention to the LAD.  Since then we have been working on improving his modifiable cardiovascular risk factors and he presents today for 25-month follow-up visit.  Over the last 6 months patient denies anginal chest pain.  However recently had nasal congestion and URI symptoms and had atypical chest pain with associated nausea and vomiting and therefore went to the ED for further evaluation back in December 2024.  High sensitive troponins were negative x 2 and he was sent home.  Overall functional capacity remains stable.  Patient wishes to be on ED medications.  Review of Systems: .   Review of Systems  Cardiovascular:  Negative for chest pain, claudication, irregular heartbeat, leg swelling, near-syncope, orthopnea, palpitations, paroxysmal nocturnal dyspnea and syncope.  Respiratory:  Negative for shortness of breath.   Hematologic/Lymphatic: Negative for  bleeding problem.    Studies Reviewed:   EKG: EKG Interpretation Date/Time:  Thursday March 15 2023 13:25:05 EST Ventricular Rate:  55 PR Interval:  168 QRS Duration:  96 QT Interval:  404 QTC Calculation: 386 R Axis:   4  Text Interpretation: Sinus bradycardia When compared with ECG of 05-Mar-2023 13:13, No significant change was found Confirmed by Large Madonna [phone]) on 03/15/2023 1:31:20 PM  Echocardiogram: Aug 04, 2022: LVEF 55 to 60%, normal diastolic function, RVSP 29.5 mmHg, estimated RAP 8 mmHg, no significant valvular heart disease.   Heart catheterization 08/04/22:  LV: 129/6, EDP 16 mmHg.  Ao 121/62, mean 90 mmHg.  No pressure gradient across the aortic valve. LVEF 55% without wall motion abnormality. LM: Large-caliber vessel.  Smooth and normal. LAD: Large-caliber vessel.  Gives origin to small-sized D1 and a moderate to large size D2.  The proximal LAD from the origin of the septal perforator 1 all the way to the mid segment has a high-grade 99% stenosis followed by a tandem 80% stenosis of the origin of D2.  TIMI II flow.  Diagonals are widely patent and normal. Cx: Moderate caliber vessel, smooth and normal. RI: Very large caliber vessel.  Smooth and normal. RCA: Dominant.  Smooth and normal.      Intervention data: Successful PTCA and stenting of the proximal and mid LAD with implantation of  3.0 x 38 mm Synergy XD DES postdilated with a 3.5 mm [place] balloon at 16 atmospheric pressure.  Stenosis reduced from 99% to 0% with TIMI II to TIMI-3 flow at the end of the procedure.   Recommendation: Patient will need DAPT for 1 year in  view of ACS.  Risk factor modification indicated.  RADIOLOGY: N/A  Risk Assessment/Calculations:   N/A   Labs:       Latest Ref Rng & Units 03/05/2023    1:30 PM 08/08/2022   11:12 PM 08/04/2022    2:52 AM  CBC  WBC 4.0 - 10.5 K/uL 7.6  7.5  6.5   Hemoglobin 13.0 - 17.0 g/dL 86.2  85.1  86.9   Hematocrit 39.0 - 52.0 % 40.8  42.9   38.1   Platelets 150 - 400 K/uL 234  245  246        Latest Ref Rng & Units 03/05/2023    1:30 PM 10/11/2022    9:18 AM 08/08/2022   11:12 PM  BMP  Glucose 70 - 99 mg/dL 879  892  886   BUN 6 - 20 mg/dL 15  21  20    Creatinine 0.61 - 1.24 mg/dL 8.98  8.90  8.89   BUN/Creat Ratio 9 - 20  19    Sodium 135 - 145 mmol/L 138  138  136   Potassium 3.5 - 5.1 mmol/L 4.8  4.5  4.8   Chloride 98 - 111 mmol/L 103  103  102   CO2 22 - 32 mmol/L 27  22  24    Calcium  8.9 - 10.3 mg/dL 89.9  9.4  9.7       Latest Ref Rng & Units 03/05/2023    1:30 PM 10/11/2022    9:18 AM 08/08/2022   11:12 PM  CMP  Glucose 70 - 99 mg/dL 879  892  886   BUN 6 - 20 mg/dL 15  21  20    Creatinine 0.61 - 1.24 mg/dL 8.98  8.90  8.89   Sodium 135 - 145 mmol/L 138  138  136   Potassium 3.5 - 5.1 mmol/L 4.8  4.5  4.8   Chloride 98 - 111 mmol/L 103  103  102   CO2 22 - 32 mmol/L 27  22  24    Calcium  8.9 - 10.3 mg/dL 89.9  9.4  9.7   Total Protein 6.0 - 8.5 g/dL  7.1    Total Bilirubin 0.0 - 1.2 mg/dL  0.5    Alkaline Phos 44 - 121 IU/L  73    AST 0 - 40 IU/L  29    ALT 0 - 44 IU/L  35      Lab Results  Component Value Date   CHOL 114 10/11/2022   HDL 35 (L) 10/11/2022   LDLCALC 56 10/11/2022   LDLDIRECT 54 10/11/2022   TRIG 126 10/11/2022   CHOLHDL 4.2 08/04/2022   Recent Labs    08/04/22 0252  LIPOA 47.8*   No components found for: NTPROBNP No results for input(s): PROBNP in the last 8760 hours. Recent Labs    08/03/22 1601  TSH 2.817    Physical Exam:    Today's Vitals   03/15/23 1321  BP: 116/80  Pulse: 64  Resp: 16  SpO2: 97%  Weight: 214 lb (97.1 kg)  Height: 5' 9 (1.753 m)   Body mass index is 31.6 kg/m. Wt Readings from Last 3 Encounters:  03/15/23 214 lb (97.1 kg)  03/05/23 203 lb (92.1 kg)  12/13/22 209 lb 7 oz (95 kg)    Physical Exam  Constitutional: No distress.  Age appropriate, hemodynamically stable.   Neck: No JVD present.  Cardiovascular: Normal rate,  regular rhythm, S1 normal, S2 normal, intact distal pulses and normal  pulses. Exam reveals no gallop, no S3 and no S4.  No murmur heard. Pulses:      Radial pulses are 2+ on the right side and 2+ on the left side.       Dorsalis pedis pulses are 2+ on the right side and 2+ on the left side.       Posterior tibial pulses are 2+ on the right side and 2+ on the left side.  Pulmonary/Chest: Effort normal and breath sounds normal. No stridor. He has no wheezes. He has no rales.  Abdominal: Soft. Bowel sounds are normal. He exhibits no distension. There is no abdominal tenderness.  Musculoskeletal:        General: No edema.     Cervical back: Neck supple.  Neurological: He is alert and oriented to person, place, and time. He has intact cranial nerves (2-12).  Skin: Skin is warm and moist.   Impression & Recommendation(s):  Impression:   ICD-10-CM   1. Atherosclerosis of native coronary artery of native heart without angina pectoris  I25.10 EKG 12-Lead    2. H/O heart artery stent  Z95.5     3. Benign hypertension  I10     4. Mixed hyperlipidemia  E78.2 Lipoprotein A (LPA)    Lipid panel    LDL cholesterol, direct    Comprehensive metabolic panel    Comprehensive metabolic panel    LDL cholesterol, direct    Lipid panel    Lipoprotein A (LPA)       Recommendation(s):  Atherosclerosis of native coronary artery of native heart without angina pectoris H/O heart artery stent Denies anginal chest pain. Continue dual antiplatelet therapy with aspirin  81 mg p.o. daily and Brilinta  90 mg p.o. twice daily. Currently on losartan  12.5 mg p.o. daily. Will discontinue isosorbide  mononitrate 30 mg p.o. daily as patient wishes to be on ED medications. Continue Toprol -XL 25 mg p.o. daily EKG today is nonischemic. ER documentation from 03/05/2023 reviewed along with labs. Completed cardiac rehab on 12/18/2022  Benign hypertension Office and home blood pressures are very well-controlled. Will  discontinue isosorbide  mononitrate for reasons mentioned above. He is asked to check his blood pressures for now to see if additional medication titration is warranted. Ideally would like to get his blood pressures less than 130 mmHg.  Mixed hyperlipidemia Currently on atorvastatin  40 mg p.o. daily and Zetia  10 mg p.o. daily.   He denies myalgia or other side effects. Most recent lipids dated October 25, 2022, independently reviewed.  LDL 47 mg/dL. Patient has a history of elevated LP(a). Will check fasting lipids and LP(a) levels prior to the next office visit.  Patient is educated that if he has taken erectile dysfunction medications in the past 72 hours he can use sublingual nitroglycerin  tablets for chest pain.  In this situation he should call 911 and go to the hospital for further evaluation and management.  Orders Placed:  Orders Placed This Encounter  Procedures   Lipoprotein A (LPA)    Standing Status:   Future    Number of Occurrences:   1    Expected Date:   09/12/2023    Expiration Date:   03/14/2024   Lipid panel    Standing Status:   Future    Number of Occurrences:   1    Expected Date:   09/12/2023    Expiration Date:   03/14/2024   LDL cholesterol, direct    Standing Status:   Future    Number of Occurrences:  1    Expected Date:   09/12/2023    Expiration Date:   03/14/2024   Comprehensive metabolic panel    Standing Status:   Future    Number of Occurrences:   1    Expected Date:   09/12/2023    Expiration Date:   03/14/2024   EKG 12-Lead   Discussed management of at least 2 chronic comorbid conditions, medication changes as noted above, EKG ordered and independently reviewed, ER documentation from 03/05/2023 as well as labs reviewed  Final Medication List:   No orders of the defined types were placed in this encounter.   Medications Discontinued During This Encounter  Medication Reason   sertraline  (ZOLOFT ) 25 MG tablet    isosorbide  mononitrate (IMDUR ) 30 MG 24 hr  tablet Discontinued by provider     Current Outpatient Medications:    acetaminophen  (TYLENOL ) 500 MG tablet, Take 500 mg by mouth every 6 (six) hours as needed for moderate pain., Disp: , Rfl:    atorvastatin  (LIPITOR) 40 MG tablet, TAKE 1 TABLET BY MOUTH EVERY DAY, Disp: 90 tablet, Rfl: 3   BRILINTA  90 MG TABS tablet, TAKE 1 TABLET BY MOUTH 2 TIMES DAILY., Disp: 60 tablet, Rfl: 3   [facility] ASPIRIN  LOW DOSE 81 MG tablet, Take 81 mg by mouth daily., Disp: , Rfl:    ezetimibe  (ZETIA ) 10 MG tablet, Take 1 tablet (10 mg total) by mouth daily., Disp: 90 tablet, Rfl: 3   famotidine  (PEPCID ) 20 MG tablet, Take 20 mg by mouth daily., Disp: , Rfl:    fexofenadine (ALLEGRA) 180 MG tablet, Take 180 mg by mouth daily., Disp: , Rfl:    levothyroxine  (SYNTHROID ) 150 MCG tablet, Take 150 mcg by mouth daily., Disp: , Rfl:    losartan  (COZAAR ) 25 MG tablet, Take 0.5 tablets (12.5 mg total) by mouth at bedtime., Disp: 90 tablet, Rfl: 3   MAGNESIUM  PO, Take 200 mg by mouth in the morning and at bedtime., Disp: , Rfl:    metoprolol  succinate (TOPROL -XL) 25 MG 24 hr tablet, TAKE 1 TABLET BY MOUTH DAILY. TAKE WITH OR IMMEDIATELY FOLLOWING A MEAL., Disp: 90 tablet, Rfl: 3   Multiple Vitamin (MULTIVITAMIN) tablet, Take 1 tablet by mouth daily., Disp: , Rfl:    nitroGLYCERIN  (NITROSTAT ) 0.4 MG SL tablet, Place 0.4 mg under the tongue every 5 (five) minutes as needed for chest pain., Disp: , Rfl:    Omega-3 Fatty Acids (FISH OIL PO), Take 1 capsule by mouth daily., Disp: , Rfl:    vitamin E 180 MG (400 UNITS) capsule, Take 400 Units by mouth daily., Disp: , Rfl:    zinc gluconate 50 MG tablet, Take 50 mg by mouth in the morning and at bedtime., Disp: , Rfl:   Consent:   N/A  Disposition:   6 months follow-up sooner if needed  His questions and concerns were addressed to his satisfaction. He voices understanding of the recommendations provided during this encounter.    Signed, Madonna Large, DO, [facility]   [facility]  [address] [place], [place] [address] 03/15/2023 1:56 PM

## 2023-03-15 NOTE — Patient Instructions (Addendum)
 Medication Instructions:  Your physician has recommended you make the following change in your medication:    STOP Isosorbide  Mononitrate (Imdur )   *If you need a refill on your cardiac medications before your next appointment, please call your pharmacy*  Lab Work: To Be Completed prior to your 6 month follow up with Dr. Michele: Lipid panel, direct LDL, CMP and a Lipoprotein A If you have labs (blood work) drawn today and your tests are completely normal, you will receive your results only by: MyChart Message (if you have MyChart) OR A paper copy in the mail If you have any lab test that is abnormal or we need to change your treatment, we will call you to review the results.  Testing/Procedures: None ordered today.  Follow-Up: At [facility], you and your health needs are our priority.  As part of our continuing mission to provide you with exceptional heart care, we have created designated Provider Care Teams.  These Care Teams include your primary Cardiologist (physician) and Advanced Practice Providers (APPs -  Physician Assistants and Nurse Practitioners) who all work together to provide you with the care you need, when you need it.  We recommend signing up for the patient portal called MyChart.  Sign up information is provided on this After Visit Summary.  MyChart is used to connect with patients for Virtual Visits (Telemedicine).  Patients are able to view lab/test results, encounter notes, upcoming appointments, etc.  Non-urgent messages can be sent to your provider as well.   To learn more about what you can do with MyChart, go to [email or web address].    Your next appointment:   6 month(s)  The format for your next appointment:   In Person  Provider:   Madonna Michele, DO {

## 2023-03-28 ENCOUNTER — Other Ambulatory Visit: Payer: Self-pay | Admitting: Urology

## 2023-03-28 DIAGNOSIS — R972 Elevated prostate specific antigen [PSA]: Secondary | ICD-10-CM

## 2023-04-17 ENCOUNTER — Ambulatory Visit
Admission: RE | Admit: 2023-04-17 | Discharge: 2023-04-17 | Disposition: A | Discharge status: Home / Self Care | Payer: 59 | Source: Ambulatory Visit | Attending: Urology | Admitting: Urology

## 2023-04-17 DIAGNOSIS — R972 Elevated prostate specific antigen [PSA]: Secondary | ICD-10-CM

## 2023-04-17 MED ORDER — GADOPICLENOL 0.5 MMOL/ML IV SOLN
10.0000 mL | Freq: Once | INTRAVENOUS | Status: AC | PRN
  Administered 2023-04-17: 10 mL via INTRAVENOUS

## 2023-04-18 LAB — COMPREHENSIVE METABOLIC PANEL
ALT: 29 [IU]/L (ref 0–44)
AST: 26 [IU]/L (ref 0–40)
Albumin: 4.5 g/dL (ref 3.8–4.9)
Alkaline Phosphatase: 59 [IU]/L (ref 44–121)
BUN/Creatinine Ratio: 16 (ref 9–20)
BUN: 17 mg/dL (ref 6–24)
Bilirubin Total: 0.5 mg/dL (ref 0.0–1.2)
CO2: 25 mmol/L (ref 20–29)
Calcium: 9.6 mg/dL (ref 8.7–10.2)
Chloride: 103 mmol/L (ref 96–106)
Creatinine, Ser: 1.09 mg/dL (ref 0.76–1.27)
Globulin, Total: 2.5 g/dL (ref 1.5–4.5)
Glucose: 107 mg/dL — ABNORMAL HIGH (ref 70–99)
Potassium: 4.7 mmol/L (ref 3.5–5.2)
Sodium: 140 mmol/L (ref 134–144)
Total Protein: 7 g/dL (ref 6.0–8.5)
eGFR: 78 mL/min/{1.73_m2} (ref >59)

## 2023-04-18 LAB — LDL CHOLESTEROL, DIRECT: LDL Direct: 59 mg/dL (ref 0–99)

## 2023-04-18 LAB — LIPID PANEL
Chol/HDL Ratio: 3.1 {ratio} (ref 0.0–5.0)
Cholesterol, Total: 120 mg/dL (ref 100–199)
HDL: 39 mg/dL — ABNORMAL LOW (ref >39)
LDL Chol Calc (NIH): 58 mg/dL (ref 0–99)
Triglycerides: 126 mg/dL (ref 0–149)
VLDL Cholesterol Cal: 23 mg/dL (ref 5–40)

## 2023-04-18 LAB — LIPOPROTEIN A (LPA): Lipoprotein (a): 27.4 nmol/L (ref <75.0)

## 2023-04-20 ENCOUNTER — Encounter: Payer: Self-pay | Admitting: Cardiology

## 2023-05-23 ENCOUNTER — Other Ambulatory Visit: Payer: Self-pay | Admitting: Cardiology

## 2023-06-03 ENCOUNTER — Emergency Department (HOSPITAL_BASED_OUTPATIENT_CLINIC_OR_DEPARTMENT_OTHER)

## 2023-06-03 ENCOUNTER — Emergency Department (HOSPITAL_BASED_OUTPATIENT_CLINIC_OR_DEPARTMENT_OTHER)
Admission: EM | Admit: 2023-06-03 | Discharge: 2023-06-03 | Disposition: A | Discharge status: Home / Self Care | Attending: Emergency Medicine | Admitting: Emergency Medicine

## 2023-06-03 DIAGNOSIS — S66327A Laceration of extensor muscle, fascia and tendon of left little finger at wrist and hand level, initial encounter: Secondary | ICD-10-CM | POA: Insufficient documentation

## 2023-06-03 DIAGNOSIS — S61217A Laceration without foreign body of left little finger without damage to nail, initial encounter: Secondary | ICD-10-CM

## 2023-06-03 DIAGNOSIS — W230XXA Caught, crushed, jammed, or pinched between moving objects, initial encounter: Secondary | ICD-10-CM | POA: Diagnosis not present

## 2023-06-03 DIAGNOSIS — S61209A Unspecified open wound of unspecified finger without damage to nail, initial encounter: Secondary | ICD-10-CM

## 2023-06-03 DIAGNOSIS — S6992XA Unspecified injury of left wrist, hand and finger(s), initial encounter: Secondary | ICD-10-CM | POA: Diagnosis present

## 2023-06-03 MED ORDER — LIDOCAINE HCL (PF) 1 % IJ SOLN
10.0000 mL | Freq: Once | INTRAMUSCULAR | Status: AC
  Administered 2023-06-03: 10 mL
  Filled 2023-06-03: qty 10

## 2023-06-03 NOTE — ED Triage Notes (Signed)
 Pt states that approximately one hour ago he was unloading a piece of lawn equipment and got his L pinky finger caught in the wheel. Pt has apparent deformity to distal joint of that finger and laceration <1" as well. He states he is unsure of his last tetanus shot, but thinks it may have been within the last ten years. No other injuries reported by pt.

## 2023-06-03 NOTE — ED Notes (Signed)
 Discharge paperwork given and verbally understood.

## 2023-06-03 NOTE — Discharge Instructions (Signed)
 As discussed, go to Dr. Greig Right office tomorrow morning at 830 as they are expecting you.  No need to call.  Wear splint until follow-up.  You may take Tylenol/Motrin for pain.  Please do not hesitate to return to the emergency department if the worrisome signs and symptoms we discussed become apparent.

## 2023-06-03 NOTE — ED Provider Notes (Signed)
 [place] EMERGENCY DEPARTMENT AT [facility] Provider Note   CSN: [id number] Arrival date & time: 06/03/23  1478     History  Chief Complaint  Patient presents with   Finger Injury    Brian Elliott is a 58 y.o. male.  HPI   58 year old male presents emergency department with complaints of left pinky finger injury.  States that Brian Elliott was attempting to move a Energy manager and got his left pinky finger caught between the wheel and a metal frame.  Reports cut to his left pinky finger and inability to move the last joint in extension.  Denies trauma elsewhere.  Denies blood thinner use.  Denies any sensory deficits distally.  Past medical history significant for BPH, unstable angina, hyperlipidemia, OSA  Home Medications Prior to Admission medications   Medication Sig Start Date End Date Taking? Authorizing Provider  acetaminophen (TYLENOL) 500 MG tablet Take 500 mg by mouth every 6 (six) hours as needed for moderate pain.    [provider]  atorvastatin (LIPITOR) 40 MG tablet TAKE 1 TABLET BY MOUTH EVERY DAY 12/25/22   Tolia, Sunit, DO  BRILINTA 90 MG TABS tablet Take 1 tablet (90 mg total) by mouth 2 (two) times daily. 05/23/23   Tolia, Sunit, DO  [facility] ASPIRIN LOW DOSE 81 MG tablet Take 81 mg by mouth daily. 07/27/22   [provider]  ezetimibe (ZETIA) 10 MG tablet Take 1 tablet (10 mg total) by mouth daily. 08/22/22 03/15/23  Tolia, Sunit, DO  famotidine (PEPCID) 20 MG tablet Take 20 mg by mouth daily. 07/11/22   [provider]  fexofenadine (ALLEGRA) 180 MG tablet Take 180 mg by mouth daily.    [provider]  levothyroxine (SYNTHROID) 150 MCG tablet Take 150 mcg by mouth daily. 09/05/19   [provider]  losartan (COZAAR) 25 MG tablet Take 0.5 tablets (12.5 mg total) by mouth at bedtime. 08/09/22 03/15/23  Custovic, Rozell Searing, DO  MAGNESIUM PO Take 200 mg by mouth in the morning and at bedtime.    [provider]  metoprolol  succinate (TOPROL-XL) 25 MG 24 hr tablet TAKE 1 TABLET BY MOUTH DAILY. TAKE WITH OR IMMEDIATELY FOLLOWING A MEAL. 12/25/22   Tolia, Sunit, DO  Multiple Vitamin (MULTIVITAMIN) tablet Take 1 tablet by mouth daily.    [provider]  nitroGLYCERIN (NITROSTAT) 0.4 MG SL tablet Place 0.4 mg under the tongue every 5 (five) minutes as needed for chest pain.    [provider]  Omega-3 Fatty Acids (FISH OIL PO) Take 1 capsule by mouth daily.    [provider]  vitamin E 180 MG (400 UNITS) capsule Take 400 Units by mouth daily.    [provider]  zinc gluconate 50 MG tablet Take 50 mg by mouth in the morning and at bedtime.    [provider]      Allergies    Patient has no known allergies.    Review of Systems   Review of Systems  All other systems reviewed and are negative.   Physical Exam Updated Vital Signs BP 109/74 (BP Location: Right Arm)   Pulse (!) 58   Resp 18   Ht 5\' 9"  (1.753 m)   Wt 94.8 kg   SpO2 97%   BMI 30.86 kg/m  Physical Exam Vitals and nursing note reviewed.  Constitutional:      General: Brian Elliott is not in acute distress.    Appearance: Brian Elliott is well-developed.  HENT:  Head: Normocephalic and atraumatic.  Eyes:     Conjunctiva/sclera: Conjunctivae normal.  Cardiovascular:     Rate and Rhythm: Normal rate and regular rhythm.     Heart sounds: No murmur heard. Pulmonary:     Effort: Pulmonary effort is normal. No respiratory distress.     Breath sounds: Normal breath sounds.  Abdominal:     Palpations: Abdomen is soft.     Tenderness: There is no abdominal tenderness.  Musculoskeletal:        General: No swelling.     Cervical back: Neck supple.     Comments: No bony tenderness of the left pinky finger.  Patient with DIP stuck in 45 degree flexion without ability to extend.  Patient unable to flex PIP of affected digit either.  2.0 cm laceration appreciated over the dorsal DIP of affected digit.  Cap refill less than  2 seconds.  Skin:    General: Skin is warm and dry.     Capillary Refill: Capillary refill takes less than 2 seconds.  Neurological:     Mental Status: Brian Elliott is alert.  Psychiatric:        Mood and Affect: Mood normal.        ED Results / Procedures / Treatments   Labs (all labs ordered are listed, but only abnormal results are displayed) Labs Reviewed - No data to display  EKG None  Radiology DG Finger Little Left Result Date: 06/03/2023 CLINICAL DATA:  Left fifth digit laceration and pain. EXAM: LEFT FINGER(S) - 2+ VIEW COMPARISON:  None Available. FINDINGS: Slight extension deformity at the fifth proximal phalangeal joint with a flexion deformity at the distal interphalangeal joint. No acute osseous abnormality. Mild degenerative changes in the fifth distal interphalangeal joint. IMPRESSION: 1. No acute findings. 2. Mild extension deformity at the fifth proximal interphalangeal joint with a flexion deformity at the fifth distal interphalangeal joint. Electronically Signed   By: Leanna Battles M.D.   On: 06/03/2023 09:33    Procedures .Laceration Repair  Date/Time: 06/03/2023 10:14 AM  Performed by: Peter Garter, PA Authorized by: Peter Garter, PA   Consent:    Consent obtained:  Verbal   Consent given by:  Patient   Risks, benefits, and alternatives were discussed: yes     Risks discussed:  Infection, need for additional repair, nerve damage and poor wound healing   Alternatives discussed:  No treatment, delayed treatment and observation Universal protocol:    Patient identity confirmed:  Verbally with patient Anesthesia:    Anesthesia method:  Local infiltration   Local anesthetic:  Lidocaine 1% w/o epi Laceration details:    Location:  Finger   Finger location:  L small finger   Length (cm):  1.5 Pre-procedure details:    Preparation:  Imaging obtained to evaluate for foreign bodies and patient was prepped and draped in usual sterile fashion Exploration:     Limited defect created (wound extended): no     Hemostasis achieved with:  Direct pressure   Imaging obtained: x-ray     Imaging outcome: foreign body not noted     Wound exploration: wound explored through full range of motion and entire depth of wound visualized     Wound extent: tendon damage     Tendon damage location:  Upper extremity   Upper extremity tendon damage location:  Finger extensor   Tendon repair plan:  Refer for evaluation   Contaminated: no   Treatment:    Area cleansed with:  Povidone-iodine and saline (Patient soaked in diluted povidone iodine bath and subsequently irrigated with 500 cc normal saline)   Amount of cleaning:  Extensive   Irrigation solution:  Sterile saline   Irrigation volume:  500 cc   Irrigation method:  Syringe   Visualized foreign bodies/material removed: no     Debridement:  None   Undermining:  None Skin repair:    Repair method:  Sutures   Suture size:  4-0   Suture material:  Prolene   Suture technique:  Simple interrupted   Number of sutures:  3 Approximation:    Approximation:  Close Repair type:    Repair type:  Simple Post-procedure details:    Dressing:  Non-adherent dressing and splint for protection   Procedure completion:  Tolerated well, no immediate complications     Medications Ordered in ED Medications  lidocaine (PF) (XYLOCAINE) 1 % injection 10 mL (10 mLs Other Given by Other 06/03/23 0953)    ED Course/ Medical Decision Making/ A&P Clinical Course as of 06/03/23 1017  Sun Jun 03, 2023  0940 Consulted Dr. Eulah Pont of hand and recommended laceration repair and follow-up outpatient tomorrow morning 830. [CR]    Clinical Course User Index [CR] Peter Garter, PA                                 Medical Decision Making Amount and/or Complexity of Data Reviewed Radiology: ordered.  Risk Prescription drug management.   This patient presents to the ED for concern of finger pain, this involves an extensive  number of treatment options, and is a complaint that carries with it a high risk of complications and morbidity.  The differential diagnosis includes fact sure, strain/pain, dislocation, ligamentous/tendinous injury, neurovascular compromise, other   Co morbidities that complicate the patient evaluation  See HPI   Additional history obtained:  Additional history obtained from EMR External records from outside source obtained and reviewed including hospital records   Lab Tests:  N/a   Imaging Studies ordered:  I ordered imaging studies including left pinky finger x-ray I independently visualized and interpreted imaging which showed no acute findings.  Mild extension deformity at the fifth proximal interphalangeal joint and flexion deformity fifth distal IP joint I agree with the radiologist interpretation   Cardiac Monitoring: / EKG:  The patient was maintained on a cardiac monitor.  I personally viewed and interpreted the cardiac monitored which showed an underlying rhythm of: Sinus rhythm   Consultations Obtained:  See ED course  Problem List / ED Course / Critical interventions / Medication management  Left finger laceration.  Tendon injury I ordered medication including lidocaine  Reevaluation of the patient after these medicines showed that the patient improved I have reviewed the patients home medicines and have made adjustments as needed   Social Determinants of Health:  Denies tobacco, illicit drug use   Test / Admission - Considered:  Vitals signs within normal range and stable throughout visit. Laboratory/imaging studies significant for: See above 58 year old male presents emergency department with complaints of left pinky finger injury.  States that Brian Elliott was attempting to move a Energy manager and got his left pinky finger caught between the wheel and a metal frame.  Reports cut to his left pinky finger and inability to move the last joint in extension.   Denies trauma elsewhere.  Denies blood thinner use.  Denies any sensory deficits distally. On exam, patient  with flexion deformity DIP joint as well as extension deformity at PIP joint of left fifth digit.  Laceration present over DIP.  Concern for extensor tendon injury.  X-ray obtained which is negative for any acute osseous abnormality.  Consulted hand specialist regarding the patient who recommended closure in the ED and follow-up tomorrow morning in the office.  Patient laceration repaired in manner as above and placed in finger splint with extension of digit by nursing staff.  Treatment plan discussed at length with patient and Brian Elliott acknowledged understanding was agreeable to said plan.  Patient overall well-appearing, afebrile in no acute distress. Worrisome signs and symptoms were discussed with the patient, and the patient acknowledged understanding to return to the ED if noticed. Patient was stable upon discharge.          Final Clinical Impression(s) / ED Diagnoses Final diagnoses:  Laceration of left little finger without foreign body without damage to nail, initial encounter  Extensor tendon laceration of finger with open wound, initial encounter    Rx / DC Orders ED Discharge Orders     None         Peter Garter, [place] 06/03/23 1017    Ernie Avena, MD 06/03/23 1126

## 2023-07-18 ENCOUNTER — Other Ambulatory Visit: Payer: Self-pay | Admitting: Cardiology

## 2023-07-18 DIAGNOSIS — Z955 Presence of coronary angioplasty implant and graft: Secondary | ICD-10-CM

## 2023-07-18 DIAGNOSIS — I2510 Atherosclerotic heart disease of native coronary artery without angina pectoris: Secondary | ICD-10-CM

## 2023-08-20 ENCOUNTER — Encounter: Payer: Self-pay | Admitting: Cardiology

## 2023-08-25 ENCOUNTER — Ambulatory Visit: Payer: Self-pay | Admitting: Cardiology

## 2023-08-25 LAB — LIPID PANEL WITH LDL/HDL RATIO
Cholesterol, Total: 132 mg/dL (ref 100–199)
HDL: 45 mg/dL (ref >39)
LDL Chol Calc (NIH): 69 mg/dL (ref 0–99)
LDL/HDL Ratio: 1.5 ratio (ref 0.0–3.6)
Triglycerides: 97 mg/dL (ref 0–149)
VLDL Cholesterol Cal: 18 mg/dL (ref 5–40)

## 2023-08-25 LAB — LDL CHOLESTEROL, DIRECT: LDL Direct: 70 mg/dL (ref 0–99)

## 2023-08-27 NOTE — Telephone Encounter (Signed)
 He completed his 1 year of dual antiplatelet therapy on Aug 04, 2023. He can finish his Brilinta  prescription for now and will hold off on refills. Definitely continue aspirin  81 mg p.o. daily as long as low risk for bleeding. Will see him at the next office visit  [place], DO, [facility]

## 2023-09-27 ENCOUNTER — Encounter: Payer: Self-pay | Admitting: Cardiology

## 2023-09-27 ENCOUNTER — Ambulatory Visit: Attending: Cardiology | Admitting: Cardiology

## 2023-09-27 VITALS — BP 116/82 | HR 65 | Resp 16 | Ht 69.0 in | Wt 223.0 lb

## 2023-09-27 DIAGNOSIS — I2510 Atherosclerotic heart disease of native coronary artery without angina pectoris: Secondary | ICD-10-CM

## 2023-09-27 DIAGNOSIS — E782 Mixed hyperlipidemia: Secondary | ICD-10-CM

## 2023-09-27 DIAGNOSIS — I10 Essential (primary) hypertension: Secondary | ICD-10-CM | POA: Diagnosis not present

## 2023-09-27 DIAGNOSIS — Z955 Presence of coronary angioplasty implant and graft: Secondary | ICD-10-CM

## 2023-09-27 MED ORDER — CLOPIDOGREL BISULFATE 75 MG PO TABS: 75.0000 mg | ORAL_TABLET | Freq: Every day | ORAL | 3 refills | Status: AC

## 2023-09-27 NOTE — Progress Notes (Signed)
 Cardiology Office Note:  .   Date:  09/27/2023  ID:  Brian Elliott, DOB August 31, 1965, MRN [id number] PCP:  Valentin Skates, DO  Former Cardiology Providers: N/A [facility] Providers Cardiologist:  Madonna Large, DO , [facility] (established care Aug 03, 2022) Electrophysiologist:  None  Click to update primary MD,subspecialty MD or APP then REFRESH:1}    Chief Complaint  Patient presents with   Atherosclerosis of native coronary artery of native heart w   Follow-up    History of Present Illness: .   Brian Elliott is a 58 y.o. Caucasian male whose past medical history and cardiovascular risk factors includes: CAD status post angioplasty and stenting to the proximal/mid LAD, Hypothyroidism, hyperlipidemia, OSA status post UP3 and on CPAP, GERD, erectile dysfunction, former smoker.   In May 2024 patient presented to the office with chest pain and hyperlipidemia.  His symptoms were very concerning for unstable angina.  He was admitted electively and underwent angiography and noted to have obstructive disease and underwent intervention to the LAD.    Since last office visit patient denies any anginal chest pain or heart failure symptoms.  No hospitalizations or urgent care visits for cardiovascular reasons.  He has been compliant with his medical therapy.  Has gained 9# over the last 6 months due to reduced physical activity (working more and hot weather). Home SBP are well controlled (home log reviewed) SBP <152mmHG.   Review of Systems: .   Review of Systems  Cardiovascular:  Negative for chest pain, claudication, irregular heartbeat, leg swelling, near-syncope, orthopnea, palpitations, paroxysmal nocturnal dyspnea and syncope.  Respiratory:  Negative for shortness of breath.   Hematologic/Lymphatic: Negative for bleeding problem.    Studies Reviewed:   Echocardiogram: Aug 04, 2022: LVEF 55 to 60%, normal diastolic function, RVSP 29.5 mmHg, estimated RAP 8 mmHg, no significant  valvular heart disease.   Heart catheterization 08/04/22:  LV: 129/6, EDP 16 mmHg.  Ao 121/62, mean 90 mmHg.  No pressure gradient across the aortic valve. LVEF 55% without wall motion abnormality. LM: Large-caliber vessel.  Smooth and normal. LAD: Large-caliber vessel.  Gives origin to small-sized D1 and a moderate to large size D2.  The proximal LAD from the origin of the septal perforator 1 all the way to the mid segment has a high-grade 99% stenosis followed by a tandem 80% stenosis of the origin of D2.  TIMI II flow.  Diagonals are widely patent and normal. Cx: Moderate caliber vessel, smooth and normal. RI: Very large caliber vessel.  Smooth and normal. RCA: Dominant.  Smooth and normal.      Intervention data: Successful PTCA and stenting of the proximal and mid LAD with implantation of  3.0 x 38 mm Synergy XD DES postdilated with a 3.5 mm [place] balloon at 16 atmospheric pressure.  Stenosis reduced from 99% to 0% with TIMI II to TIMI-3 flow at the end of the procedure.   Recommendation: Patient will need DAPT for 1 year in view of ACS.  Risk factor modification indicated.  RADIOLOGY: N/A  Risk Assessment/Calculations:   N/A   Labs:       Latest Ref Rng & Units 03/05/2023    1:30 PM 08/08/2022   11:12 PM 08/04/2022    2:52 AM  CBC  WBC 4.0 - 10.5 K/uL 7.6  7.5  6.5   Hemoglobin 13.0 - 17.0 g/dL 86.2  85.1  86.9   Hematocrit 39.0 - 52.0 % 40.8  42.9  38.1   Platelets  150 - 400 K/uL 234  245  246        Latest Ref Rng & Units 04/17/2023    9:07 AM 03/05/2023    1:30 PM 10/11/2022    9:18 AM  BMP  Glucose 70 - 99 mg/dL 892  879  892   BUN 6 - 24 mg/dL 17  15  21    Creatinine 0.76 - 1.27 mg/dL 8.90  8.98  8.90   BUN/Creat Ratio 9 - 20 16   19    Sodium 134 - 144 mmol/L 140  138  138   Potassium 3.5 - 5.2 mmol/L 4.7  4.8  4.5   Chloride 96 - 106 mmol/L 103  103  103   CO2 20 - 29 mmol/L 25  27  22    Calcium  8.7 - 10.2 mg/dL 9.6  89.9  9.4       Latest Ref Rng & Units  04/17/2023    9:07 AM 03/05/2023    1:30 PM 10/11/2022    9:18 AM  CMP  Glucose 70 - 99 mg/dL 892  879  892   BUN 6 - 24 mg/dL 17  15  21    Creatinine 0.76 - 1.27 mg/dL 8.90  8.98  8.90   Sodium 134 - 144 mmol/L 140  138  138   Potassium 3.5 - 5.2 mmol/L 4.7  4.8  4.5   Chloride 96 - 106 mmol/L 103  103  103   CO2 20 - 29 mmol/L 25  27  22    Calcium  8.7 - 10.2 mg/dL 9.6  89.9  9.4   Total Protein 6.0 - 8.5 g/dL 7.0   7.1   Total Bilirubin 0.0 - 1.2 mg/dL 0.5   0.5   Alkaline Phos 44 - 121 IU/L 59   73   AST 0 - 40 IU/L 26   29   ALT 0 - 44 IU/L 29   35     Lab Results  Component Value Date   CHOL 132 08/24/2023   HDL 45 08/24/2023   LDLCALC 69 08/24/2023   LDLDIRECT 70 08/24/2023   TRIG 97 08/24/2023   CHOLHDL 3.1 04/17/2023   Recent Labs    04/17/23 0907  LIPOA 27.4   No components found for: NTPROBNP No results for input(s): PROBNP in the last 8760 hours. No results for input(s): TSH in the last 8760 hours.   Physical Exam:    Today's Vitals   09/27/23 1050  BP: 116/82  Pulse: 65  Resp: 16  SpO2: 98%  Weight: 223 lb (101.2 kg)  Height: 5' 9 (1.753 m)   Body mass index is 32.93 kg/m. Wt Readings from Last 3 Encounters:  09/27/23 223 lb (101.2 kg)  06/03/23 209 lb (94.8 kg)  03/15/23 214 lb (97.1 kg)    Physical Exam  Constitutional: No distress.  Age appropriate, hemodynamically stable.   Neck: No JVD present.  Cardiovascular: Normal rate, regular rhythm, S1 normal, S2 normal, intact distal pulses and normal pulses. Exam reveals no gallop, no S3 and no S4.  No murmur heard. Pulses:      Radial pulses are 2+ on the right side and 2+ on the left side.       Dorsalis pedis pulses are 2+ on the right side and 2+ on the left side.       Posterior tibial pulses are 2+ on the right side and 2+ on the left side.  Pulmonary/Chest: Effort normal and breath sounds normal. No  stridor. He has no wheezes. He has no rales.  Abdominal: Soft. Bowel sounds are  normal. He exhibits no distension. There is no abdominal tenderness.  Musculoskeletal:        General: No edema.     Cervical back: Neck supple.  Neurological: He is alert and oriented to person, place, and time. He has intact cranial nerves (2-12).  Skin: Skin is warm and moist.   Impression & Recommendation(s):  Impression:   ICD-10-CM   1. Atherosclerosis of native coronary artery of native heart without angina pectoris  I25.10 clopidogrel  (PLAVIX ) 75 MG tablet    2. H/O heart artery stent  Z95.5 clopidogrel  (PLAVIX ) 75 MG tablet    3. Benign hypertension  I10     4. Mixed hyperlipidemia  E78.2        Recommendation(s):  Atherosclerosis of native coronary artery of native heart without angina pectoris H/O heart artery stent Denies anginal chest pain. Status post PTCA and stenting in the proximal and mid LAD, May 2024. Completed 1 year of dual antiplatelet therapy in view of his prior ACS. Currently on aspirin  81 mg p.o. daily, discontinue. Start Plavix  75 mg p.o. daily. Continue lipid-lowering agents: Atorvastatin  40 mg p.o. daily and Zetia  10 mg p.o. daily Reemphasized importance of secondary prevention. Increase physical activity as tolerated with a goal of 30 minutes a day 5 days a week  Benign hypertension Office and home blood pressures are very well-controlled. D/c'd isosorbide  mononitrate in the past as he wanted to be ED medications. Office and home blood pressures are very well-controlled. Continue losartan  12.5 mg p.o. every afternoon. Continue Toprol -XL 25 mg p.o. every morning  Mixed hyperlipidemia When he had his unstable angina event back in May 2024 patient's LDL levels were slightly above 90 mg/dL. With up titration of lipid-lowering agents his LDL levels had come down close to 54 mg/dL. Currently LDL 70mg /dL as of 93/86/7974 Recommended goal LDL at least less than 70 mg/dL and if possible ideally <55 mg/dL I am hopeful that the levels will improve once  his physical activity increases and he loses weight. For now we will continue current medical therapy. He will have repeat fasting lipids checked with PCP and will send us  a copy for reference  Orders Placed:  No orders of the defined types were placed in this encounter.  Medical decision making: Discussed management of at least 2 chronic comorbid conditions Prescription drug management Independently reviewed labs from August 24, 2023 Reviewed prior heart catheterization report from May 2024 Coordination of care  Final Medication List:    Meds ordered this encounter  Medications   clopidogrel  (PLAVIX ) 75 MG tablet    Sig: Take 1 tablet (75 mg total) by mouth daily.    Dispense:  90 tablet    Refill:  3    Medications Discontinued During This Encounter  Medication Reason   [facility] ASPIRIN  LOW DOSE 81 MG tablet Discontinued by provider   BRILINTA  90 MG TABS tablet Change in therapy      Current Outpatient Medications:    acetaminophen  (TYLENOL ) 500 MG tablet, Take 500 mg by mouth every 6 (six) hours as needed for moderate pain., Disp: , Rfl:    atorvastatin  (LIPITOR) 40 MG tablet, TAKE 1 TABLET BY MOUTH EVERY DAY, Disp: 90 tablet, Rfl: 3   clopidogrel  (PLAVIX ) 75 MG tablet, Take 1 tablet (75 mg total) by mouth daily., Disp: 90 tablet, Rfl: 3   ezetimibe  (ZETIA ) 10 MG tablet, TAKE 1 TABLET BY MOUTH  EVERY DAY, Disp: 90 tablet, Rfl: 2   famotidine  (PEPCID ) 20 MG tablet, Take 20 mg by mouth daily., Disp: , Rfl:    fexofenadine (ALLEGRA) 180 MG tablet, Take 180 mg by mouth daily., Disp: , Rfl:    levothyroxine  (SYNTHROID ) 150 MCG tablet, Take 150 mcg by mouth daily., Disp: , Rfl:    losartan  (COZAAR ) 25 MG tablet, Take 0.5 tablets (12.5 mg total) by mouth at bedtime., Disp: 90 tablet, Rfl: 3   MAGNESIUM  PO, Take 200 mg by mouth in the morning and at bedtime., Disp: , Rfl:    metoprolol  succinate (TOPROL -XL) 25 MG 24 hr tablet, TAKE 1 TABLET BY MOUTH DAILY. TAKE WITH OR IMMEDIATELY  FOLLOWING A MEAL., Disp: 90 tablet, Rfl: 3   Multiple Vitamin (MULTIVITAMIN) tablet, Take 1 tablet by mouth daily., Disp: , Rfl:    nitroGLYCERIN  (NITROSTAT ) 0.4 MG SL tablet, Place 0.4 mg under the tongue every 5 (five) minutes as needed for chest pain., Disp: , Rfl:    Omega-3 Fatty Acids (FISH OIL PO), Take 1 capsule by mouth daily., Disp: , Rfl:    vitamin E 180 MG (400 UNITS) capsule, Take 400 Units by mouth daily., Disp: , Rfl:    zinc gluconate 50 MG tablet, Take 50 mg by mouth in the morning and at bedtime., Disp: , Rfl:   Consent:   N/A  Disposition:   1 year follow-up sooner if needed  His questions and concerns were addressed to his satisfaction. He voices understanding of the recommendations provided during this encounter.    Signed, Madonna Michele HAS, [facility]  A Division of [facility] [address], [place], [place] [address]  [place], [place] [address]  09/27/2023 12:31 PM

## 2023-09-27 NOTE — Patient Instructions (Signed)
 Medication Instructions:  Your physician has recommended you make the following change in your medication:   1-STOP Aspirin  2-STOP Brilinta  3-START Plavix  75 mg by mouth daily. *If you need a refill on your cardiac medications before your next appointment, please call your pharmacy*  Lab Work: If you have labs (blood work) drawn today and your tests are completely normal, you will receive your results only by: MyChart Message (if you have MyChart) OR A paper copy in the mail If you have any lab test that is abnormal or we need to change your treatment, we will call you to review the results.  Testing/Procedures: None ordered today.  Follow-Up: At [facility], you and your health needs are our priority.  As part of our continuing mission to provide you with exceptional heart care, our providers are all part of one team.  This team includes your primary Cardiologist (physician) and Advanced Practice Providers or APPs (Physician Assistants and Nurse Practitioners) who all work together to provide you with the care you need, when you need it.  Your next appointment:   1 year(s)  Provider:   Madonna Large, DO    We recommend signing up for the patient portal called MyChart.  Sign up information is provided on this After Visit Summary.  MyChart is used to connect with patients for Virtual Visits (Telemedicine).  Patients are able to view lab/test results, encounter notes, upcoming appointments, etc.  Non-urgent messages can be sent to your provider as well.   To learn more about what you can do with MyChart, go to [email or web address].

## 2023-10-29 ENCOUNTER — Encounter: Payer: Self-pay | Admitting: Cardiology

## 2023-10-29 MED ORDER — LOSARTAN POTASSIUM 25 MG PO TABS: 12.5000 mg | ORAL_TABLET | Freq: Every day | ORAL | 3 refills | Status: AC

## 2023-11-06 ENCOUNTER — Other Ambulatory Visit: Payer: Self-pay | Admitting: Internal Medicine

## 2023-11-06 DIAGNOSIS — R911 Solitary pulmonary nodule: Secondary | ICD-10-CM

## 2023-12-03 NOTE — Progress Notes (Signed)
 This encounter was created in error - please disregard.

## 2023-12-22 ENCOUNTER — Other Ambulatory Visit: Payer: Self-pay | Admitting: Cardiology

## 2024-01-18 ENCOUNTER — Encounter: Payer: Self-pay | Admitting: Cardiology

## 2024-01-18 ENCOUNTER — Other Ambulatory Visit: Payer: Self-pay

## 2024-01-18 DIAGNOSIS — Z955 Presence of coronary angioplasty implant and graft: Secondary | ICD-10-CM

## 2024-01-18 DIAGNOSIS — I2510 Atherosclerotic heart disease of native coronary artery without angina pectoris: Secondary | ICD-10-CM

## 2024-01-18 NOTE — Telephone Encounter (Signed)
 Spoke with patient over the phone informing him that I spoke with pharmacy who states they filled a 90 day supply less than a month ago. Patient states that they only gave him 30 pills. Instructed [facility] to [facility] to see if they can remedy this as insurance may not cover another prescription if it is shown as being filled. Instructed patient to reach out if there is no resolution.

## 2024-01-21 MED ORDER — EZETIMIBE 10 MG PO TABS: 10.0000 mg | ORAL_TABLET | Freq: Every day | ORAL | 2 refills | Status: AC

## 2024-03-27 ENCOUNTER — Ambulatory Visit
Admission: RE | Admit: 2024-03-27 | Discharge: 2024-03-27 | Disposition: A | Source: Ambulatory Visit | Attending: Internal Medicine

## 2024-03-27 DIAGNOSIS — R911 Solitary pulmonary nodule: Secondary | ICD-10-CM

## 2024-04-14 ENCOUNTER — Encounter: Payer: Self-pay | Admitting: Cardiology

## 2024-04-18 ENCOUNTER — Telehealth: Payer: Self-pay

## 2024-04-18 NOTE — Telephone Encounter (Signed)
"  ° °  Pre-operative Risk Assessment    Patient Name: Brian Elliott  DOB: Jan 25, 1966 MRN: [id number]   Date of last office visit: 09/27/23 Dr. Michele Date of next office visit: Not Scheduled   Request for Surgical Clearance    Procedure:  Prostate Biopsy  Date of Surgery:  Clearance TBD                                Surgeon:  Dr. Norleen Socks Group or Practice Name:  [facility] Phone number:  [phone] Fax number:  [phone]   Type of Clearance Requested:   - Medical  - Pharmacy:  Hold Clopidogrel  (Plavix ) 3 days   Type of Anesthesia:  not provided   Additional requests/questions:    Bonney Sharlet Hint   04/18/2024, 5:16 PM   "
